# Patient Record
Sex: Female | Born: 1965 | Race: Black or African American | Hispanic: No | Marital: Single | Smoking: Never smoker
Health system: Southern US, Community
[De-identification: ages and names within clinical notes are randomized; demographics above are authoritative.]

## PROBLEM LIST (undated history)

## (undated) DIAGNOSIS — I10 Essential (primary) hypertension: Secondary | ICD-10-CM

## (undated) DIAGNOSIS — K219 Gastro-esophageal reflux disease without esophagitis: Secondary | ICD-10-CM

## (undated) HISTORY — DX: Essential (primary) hypertension: I10

## (undated) HISTORY — PX: ABDOMINAL HYSTERECTOMY: SHX81

---

## 2004-12-08 ENCOUNTER — Ambulatory Visit: Payer: Self-pay | Admitting: Obstetrics and Gynecology

## 2007-11-26 ENCOUNTER — Ambulatory Visit: Payer: Self-pay | Admitting: Obstetrics and Gynecology

## 2007-12-23 ENCOUNTER — Ambulatory Visit: Payer: Self-pay | Admitting: Obstetrics and Gynecology

## 2007-12-29 ENCOUNTER — Inpatient Hospital Stay: Payer: Self-pay | Admitting: Obstetrics and Gynecology

## 2008-12-08 ENCOUNTER — Ambulatory Visit: Payer: Self-pay | Admitting: Obstetrics and Gynecology

## 2009-12-27 ENCOUNTER — Ambulatory Visit: Payer: Self-pay | Admitting: Obstetrics and Gynecology

## 2011-02-06 ENCOUNTER — Ambulatory Visit: Payer: Self-pay | Admitting: Obstetrics and Gynecology

## 2011-02-09 ENCOUNTER — Ambulatory Visit: Payer: Self-pay | Admitting: Obstetrics and Gynecology

## 2011-03-06 ENCOUNTER — Ambulatory Visit: Payer: Self-pay | Admitting: Surgery

## 2011-03-06 HISTORY — PX: BREAST BIOPSY: SHX20

## 2011-03-08 LAB — PATHOLOGY REPORT

## 2011-09-03 ENCOUNTER — Ambulatory Visit: Payer: Self-pay | Admitting: Surgery

## 2012-03-12 ENCOUNTER — Ambulatory Visit: Payer: Self-pay | Admitting: Obstetrics and Gynecology

## 2013-03-16 ENCOUNTER — Ambulatory Visit: Payer: Self-pay | Admitting: Obstetrics and Gynecology

## 2014-03-17 ENCOUNTER — Ambulatory Visit: Payer: Self-pay | Admitting: Obstetrics and Gynecology

## 2015-03-14 ENCOUNTER — Other Ambulatory Visit: Payer: Self-pay | Admitting: Obstetrics and Gynecology

## 2015-03-14 DIAGNOSIS — Z1231 Encounter for screening mammogram for malignant neoplasm of breast: Secondary | ICD-10-CM

## 2015-03-21 ENCOUNTER — Ambulatory Visit
Admission: RE | Admit: 2015-03-21 | Discharge: 2015-03-21 | Disposition: A | Discharge status: Home / Self Care | Payer: PRIVATE HEALTH INSURANCE | Source: Ambulatory Visit | Attending: Obstetrics and Gynecology | Admitting: Obstetrics and Gynecology

## 2015-03-21 DIAGNOSIS — Z1231 Encounter for screening mammogram for malignant neoplasm of breast: Secondary | ICD-10-CM | POA: Diagnosis not present

## 2016-03-23 ENCOUNTER — Ambulatory Visit: Payer: Self-pay | Admitting: Physician Assistant

## 2016-03-23 ENCOUNTER — Encounter: Payer: Self-pay | Admitting: Physician Assistant

## 2016-03-23 VITALS — BP 130/90 | HR 74 | Temp 98.5°F

## 2016-03-23 DIAGNOSIS — J0100 Acute maxillary sinusitis, unspecified: Secondary | ICD-10-CM

## 2016-03-23 MED ORDER — FLUTICASONE PROPIONATE 50 MCG/ACT NA SUSP: 2.0000 | Freq: Every day | NASAL | 6 refills | Status: DC

## 2016-03-23 MED ORDER — AMOXICILLIN 875 MG PO TABS: 875.0000 mg | ORAL_TABLET | Freq: Two times a day (BID) | ORAL | 0 refills | Status: DC

## 2016-03-23 MED ORDER — FLUCONAZOLE 150 MG PO TABS: ORAL_TABLET | ORAL | 0 refills | Status: DC

## 2016-03-23 NOTE — Progress Notes (Signed)
S: C/o sinus pain and  congestion for 3 days, some sore throat, face felt warm, using neti pot without relief, no fever, chills, cp/sob, v/d; mucus is green and thick, cough is sporadic, c/o of facial and dental pain.   Using otc meds:   O: PE: vitals wnl, nad, perrl eomi, normocephalic, tms dull, nasal mucosa red and swollen, throat injected, neck supple no lymph, lungs c t a, cv rrr, neuro intact  A:  Acute sinusitis   P: drink fluids, continue regular meds , use otc meds of choice, return if not improving in 5 days, return earlier if worsening . Amoxil , flonase, diflucan

## 2016-04-02 ENCOUNTER — Other Ambulatory Visit: Payer: Self-pay | Admitting: Obstetrics and Gynecology

## 2016-04-02 DIAGNOSIS — Z1231 Encounter for screening mammogram for malignant neoplasm of breast: Secondary | ICD-10-CM

## 2016-05-08 ENCOUNTER — Ambulatory Visit: Payer: PRIVATE HEALTH INSURANCE | Attending: Obstetrics and Gynecology

## 2016-05-09 ENCOUNTER — Ambulatory Visit
Admission: RE | Admit: 2016-05-09 | Discharge: 2016-05-09 | Disposition: A | Discharge status: Home / Self Care | Payer: Managed Care, Other (non HMO) | Source: Ambulatory Visit | Attending: Obstetrics and Gynecology | Admitting: Obstetrics and Gynecology

## 2016-05-09 DIAGNOSIS — Z1231 Encounter for screening mammogram for malignant neoplasm of breast: Secondary | ICD-10-CM

## 2016-08-06 ENCOUNTER — Other Ambulatory Visit: Payer: Self-pay | Admitting: Physician Assistant

## 2016-08-06 NOTE — Telephone Encounter (Signed)
Med refill for allegra d

## 2017-02-06 ENCOUNTER — Encounter: Payer: Self-pay | Admitting: Physician Assistant

## 2017-02-06 ENCOUNTER — Ambulatory Visit: Payer: Self-pay | Admitting: Physician Assistant

## 2017-02-06 VITALS — BP 129/79 | HR 80 | Temp 98.5°F | Resp 16

## 2017-02-06 DIAGNOSIS — S61211A Laceration without foreign body of left index finger without damage to nail, initial encounter: Secondary | ICD-10-CM

## 2017-02-06 MED ORDER — TETANUS-DIPHTH-ACELL PERTUSSIS 5-2.5-18.5 LF-MCG/0.5 IM SUSP
0.5000 mL | Freq: Once | INTRAMUSCULAR | Status: AC
  Administered 2017-02-06: 0.5 mL via INTRAMUSCULAR

## 2017-02-06 NOTE — Progress Notes (Signed)
S: pt c/o cutting finger around 1130 am today, was cutting a watermelon at home and the knife slipped, area has been bleeding, ?if needs stitches, unknown tdap  O: vitals wnl, nad, skin on left infex finger with 1 cm superficial laceration, no fatty tissue noted, no fb, no tendon involvment, full rom of finger, n/v intact, used 1% xylocain digital block, area cleaned with betadine, rinsed with saline, used 5-0 ethilon for 3 simple sutures, neosporin and bandage applied,  pt tolerated procedure well  A: laceration with suture repair  P: suture care explained, remove sutures in 7 days, tdap given in clinic

## 2017-02-13 ENCOUNTER — Ambulatory Visit: Payer: Self-pay | Admitting: Physician Assistant

## 2017-02-13 ENCOUNTER — Encounter: Payer: Self-pay | Admitting: Physician Assistant

## 2017-02-13 VITALS — BP 119/70 | HR 76 | Temp 98.5°F | Resp 16

## 2017-02-13 DIAGNOSIS — Z4802 Encounter for removal of sutures: Secondary | ICD-10-CM

## 2017-02-13 NOTE — Progress Notes (Signed)
S: had sutures in finger a week ago, area is well healed, one suture came out on its own, no fever/chills or drainage  O: vitals wnl, nad, skin with well approximated wound, healed, no pus or drainage noted, sutures removed  A: suture removal  P: f/u prn

## 2017-04-05 ENCOUNTER — Other Ambulatory Visit: Payer: Self-pay | Admitting: Obstetrics and Gynecology

## 2017-04-05 DIAGNOSIS — Z1231 Encounter for screening mammogram for malignant neoplasm of breast: Secondary | ICD-10-CM

## 2017-05-13 ENCOUNTER — Ambulatory Visit
Admission: RE | Admit: 2017-05-13 | Discharge: 2017-05-13 | Disposition: A | Discharge status: Home / Self Care | Payer: Managed Care, Other (non HMO) | Source: Ambulatory Visit | Attending: Obstetrics and Gynecology | Admitting: Obstetrics and Gynecology

## 2017-05-13 DIAGNOSIS — Z1231 Encounter for screening mammogram for malignant neoplasm of breast: Secondary | ICD-10-CM

## 2017-05-14 ENCOUNTER — Other Ambulatory Visit: Payer: Self-pay | Admitting: Obstetrics and Gynecology

## 2017-05-14 DIAGNOSIS — R928 Other abnormal and inconclusive findings on diagnostic imaging of breast: Secondary | ICD-10-CM

## 2017-05-14 DIAGNOSIS — N6489 Other specified disorders of breast: Secondary | ICD-10-CM

## 2017-05-14 DIAGNOSIS — R921 Mammographic calcification found on diagnostic imaging of breast: Secondary | ICD-10-CM

## 2017-05-23 ENCOUNTER — Ambulatory Visit
Admission: RE | Admit: 2017-05-23 | Discharge: 2017-05-23 | Disposition: A | Discharge status: Home / Self Care | Payer: Managed Care, Other (non HMO) | Source: Ambulatory Visit | Attending: Obstetrics and Gynecology | Admitting: Obstetrics and Gynecology

## 2017-05-23 DIAGNOSIS — N6002 Solitary cyst of left breast: Secondary | ICD-10-CM | POA: Insufficient documentation

## 2017-05-23 DIAGNOSIS — R921 Mammographic calcification found on diagnostic imaging of breast: Secondary | ICD-10-CM | POA: Diagnosis not present

## 2017-05-23 DIAGNOSIS — N6489 Other specified disorders of breast: Secondary | ICD-10-CM | POA: Insufficient documentation

## 2017-05-23 DIAGNOSIS — R928 Other abnormal and inconclusive findings on diagnostic imaging of breast: Secondary | ICD-10-CM

## 2017-05-28 ENCOUNTER — Other Ambulatory Visit: Payer: Self-pay | Admitting: Obstetrics and Gynecology

## 2017-05-28 DIAGNOSIS — R928 Other abnormal and inconclusive findings on diagnostic imaging of breast: Secondary | ICD-10-CM

## 2017-05-28 DIAGNOSIS — R921 Mammographic calcification found on diagnostic imaging of breast: Secondary | ICD-10-CM

## 2017-05-30 ENCOUNTER — Ambulatory Visit
Admission: RE | Admit: 2017-05-30 | Discharge: 2017-05-30 | Disposition: A | Discharge status: Home / Self Care | Payer: Managed Care, Other (non HMO) | Source: Ambulatory Visit | Attending: Obstetrics and Gynecology | Admitting: Obstetrics and Gynecology

## 2017-05-30 DIAGNOSIS — R928 Other abnormal and inconclusive findings on diagnostic imaging of breast: Secondary | ICD-10-CM

## 2017-05-30 DIAGNOSIS — R921 Mammographic calcification found on diagnostic imaging of breast: Secondary | ICD-10-CM | POA: Diagnosis not present

## 2017-05-30 HISTORY — PX: BREAST BIOPSY: SHX20

## 2017-06-03 LAB — SURGICAL PATHOLOGY

## 2017-07-17 ENCOUNTER — Ambulatory Visit: Payer: Self-pay | Admitting: Family Medicine

## 2017-07-17 VITALS — BP 160/93 | HR 72 | Temp 98.6°F

## 2017-07-17 DIAGNOSIS — J0100 Acute maxillary sinusitis, unspecified: Secondary | ICD-10-CM

## 2017-07-17 DIAGNOSIS — J358 Other chronic diseases of tonsils and adenoids: Secondary | ICD-10-CM

## 2017-07-17 MED ORDER — FLUCONAZOLE 150 MG PO TABS: 150.0000 mg | ORAL_TABLET | Freq: Once | ORAL | 0 refills | Status: AC

## 2017-07-17 MED ORDER — AMOXICILLIN-POT CLAVULANATE 875-125 MG PO TABS: 1.0000 | ORAL_TABLET | Freq: Two times a day (BID) | ORAL | 0 refills | Status: DC

## 2017-07-17 NOTE — Progress Notes (Signed)
Pt reports maxillary sinus pressure worsening over past 2-3 days. +PND, L ear fullness. Denies cough other than first than in the morning. Also c/o L tonsil stone. Hx of same, normally resolves within a day, but this time has not. Has been using saltwater gargles.

## 2017-07-17 NOTE — Patient Instructions (Signed)
Try to express the tonsillar debris as discussed.  Gargle with salt water several times a day  If the throat keeps bothering you return for a recheck and/or see your primary care doctor or ear nose and throat doctor.  Take the Augmentin 1 twice daily (amoxicillin/clavulanate)  If you get any vaginal yeast symptoms you can take the Diflucan 1 pill initially and repeat in 3 days only if needed.  You have some fluticasone (Flonase) already.  You can use it 2 sprays each nostril daily if needed.

## 2017-07-17 NOTE — Progress Notes (Signed)
Patient ID: Kim Morrison, female    DOB: 1966/05/07  Age: 52 y.o. MRN: [id number]  Chief Complaint  Patient presents with  . Sinusitis    Subjective:   Patient has been having congestion and facial pain a little cough and has noticed tonsillar debris on the left side which is bothering her today.    Current allergies, medications, problem list, past/family and social histories reviewed.  Objective:  BP (!) 160/93 (BP Location: Left Arm, Patient Position: Sitting)   Pulse 72   Temp 98.6 F (37 C) (Tympanic)   SpO2 97%   No major distress.  TMs normal.  Mild sinus tenderness.  Throat not erythematous.  She has white tonsillar debris just behind the edge of the tongue.  Attempt was made to express this using a tongue blade, but I was unable to get it to pop out.  Spoke to her about how to try and express it.  I irritated that a little bit trying to squeeze it out.   Assessment & Plan:   Assessment: 1. Tonsillar debris   2. Subacute maxillary sinusitis       Plan: See instructions  No orders of the defined types were placed in this encounter.   No orders of the defined types were placed in this encounter.        Patient Instructions  Try to express the tonsillar debris as discussed.  Gargle with salt water several times a day  If the throat keeps bothering you return for a recheck and/or see your primary care doctor or ear nose and throat doctor.  Take the Augmentin 1 twice daily (amoxicillin/clavulanate)  If you get any vaginal yeast symptoms you can take the Diflucan 1 pill initially and repeat in 3 days only if needed.  You have some fluticasone (Flonase) already.  You can use it 2 sprays each nostril daily if needed.     Return if symptoms worsen or fail to improve.   HOPPER,DAVID, MD 07/17/2017

## 2017-07-26 ENCOUNTER — Encounter: Payer: Self-pay | Admitting: Physician Assistant

## 2017-07-26 ENCOUNTER — Ambulatory Visit: Payer: Self-pay | Admitting: Physician Assistant

## 2017-07-26 VITALS — BP 120/88 | HR 72 | Temp 97.8°F

## 2017-07-26 DIAGNOSIS — M778 Other enthesopathies, not elsewhere classified: Secondary | ICD-10-CM

## 2017-07-26 MED ORDER — NAPROXEN 500 MG PO TABS: 500.0000 mg | ORAL_TABLET | Freq: Two times a day (BID) | ORAL | Status: DC

## 2017-07-26 NOTE — Progress Notes (Signed)
   Subjective: Right wrist pain    Patient ID: Kim Morrison, female    DOB: 07-Jun-1966, 52 y.o.   MRN: [id number]  HPI Patient complain of right wrist pain for approximately 2 weeks.  Patient states the only provocative incident is repetitive twisting of the using keys to unlock and lateral cell doors.  Patient denies loss of sensation or function.  Patient is right-hand dominant.  Review of Systems Negative except for complaint    Objective:   Physical Exam Examination of the right wrist hand reveals no deformity.  Patient has full neck range of motion.  Patient is moderate crepitus to palpation base of the proximal phalanges.       Assessment & Plan: Tendinitis right wrist.  Patient placed in a splint and given a prescription for naproxen.  Patient placed on work restrictions for 1 week.  Patient will return in 1 week for reevaluation.

## 2017-08-02 ENCOUNTER — Ambulatory Visit: Payer: Self-pay | Admitting: Family Medicine

## 2017-08-02 ENCOUNTER — Encounter: Payer: Self-pay | Admitting: Family Medicine

## 2017-08-02 VITALS — BP 136/88 | HR 70 | Temp 98.2°F | Resp 18

## 2017-08-02 DIAGNOSIS — M778 Other enthesopathies, not elsewhere classified: Secondary | ICD-10-CM

## 2017-08-02 NOTE — Progress Notes (Signed)
   Subjective: Right wrist pain    Patient ID: Kim Morrison, female    DOB: 1965/07/24, 52 y.o.   MRN: [id number]  HPI 07/26/17: Patient complain of right wrist pain for approximately 2 weeks.  Patient states the only provocative incident is repetitive twisting of the using keys to unlock and lateral cell doors.  Patient denies loss of sensation or function.  Patient is right-hand dominant. 08/02/17: Patient returns today for follow-up of right wrist pain.  Patient reports the pain has improved with a week of light duty, naproxen, and wearing a brace.  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  Patient reports tolerating naproxen well.  Patient reports pain is due to overuse not related to a specific injury.  Patient reports improved function of her wrist overall. Review of Systems Negative except for complaint    Objective:   Physical Exam 07/26/17: Examination of the right wrist hand reveals no deformity.  Patient has full neck range of motion.  Patient is moderate crepitus to palpation base of the proximal phalanges. 08/02/17: Examination unchanged from previous visit.  No ecchymosis, edema, deformity.       Assessment & Plan: Tendinitis right wrist.  07/26/17: Patient placed in a splint and given a prescription for naproxen.  Patient placed on work restrictions for 1 week.  Patient will return in 1 week for reevaluation. 08/02/17: Extended work restrictions for another week, recommended gradually resuming normal work activities following that.  Recommended using the splint for an additional week but to make sure to stretch her thumb and wrist.  Recommended an additional 2 weeks of naproxen for pain and inflammation and discussed GI protection with lansoprazole.  Educated patient to begin home stretches and exercises once the pain begins to improve further.  Also discussed ergonomic assessment of her  work space to help with the resolution of her symptoms and prevention of recurrence in the future.  Informed the patient to call or follow-up with her primary care provider in 4 weeks if she has not experienced any improvement will consider referral for physical therapy or orthopedics at that time.

## 2017-08-05 ENCOUNTER — Ambulatory Visit: Payer: Self-pay

## 2017-09-18 ENCOUNTER — Telehealth: Payer: Self-pay

## 2017-09-18 NOTE — Telephone Encounter (Signed)
Received refill fax for Fluticasone 87mcg and Allegra-D 12hr  Last OV: 08/02/17

## 2017-09-19 ENCOUNTER — Other Ambulatory Visit: Payer: Self-pay | Admitting: Family Medicine

## 2017-09-19 DIAGNOSIS — J309 Allergic rhinitis, unspecified: Secondary | ICD-10-CM

## 2017-09-19 MED ORDER — FEXOFENADINE-PSEUDOEPHED ER 60-120 MG PO TB12: 1.0000 | ORAL_TABLET | Freq: Two times a day (BID) | ORAL | 0 refills | Status: DC

## 2017-09-19 MED ORDER — FLUTICASONE PROPIONATE 50 MCG/ACT NA SUSP: NASAL | 0 refills | Status: AC

## 2017-09-19 NOTE — Telephone Encounter (Signed)
I sent in one refill for each. Please inform the patient that she needs to obtain future refills from a primary care provider.

## 2017-11-01 ENCOUNTER — Other Ambulatory Visit: Payer: Self-pay | Admitting: Obstetrics and Gynecology

## 2017-11-01 DIAGNOSIS — R921 Mammographic calcification found on diagnostic imaging of breast: Secondary | ICD-10-CM

## 2017-11-07 ENCOUNTER — Ambulatory Visit: Payer: Self-pay | Admitting: Family Medicine

## 2017-11-07 VITALS — BP 144/88 | HR 69 | Resp 16 | Ht 67.0 in | Wt 184.0 lb

## 2017-11-07 DIAGNOSIS — Z0189 Encounter for other specified special examinations: Principal | ICD-10-CM

## 2017-11-07 DIAGNOSIS — Z008 Encounter for other general examination: Secondary | ICD-10-CM

## 2017-11-07 NOTE — Progress Notes (Signed)
Subjective: Annual biometrics screening  Patient presents for her annual biometric screening. Patient reports eating a healthy, well-rounded diet and getting regular exercise.  Patient sees her OB/GYN for primary care. PCP: Dr. Leafy Ro. Patient works for the [employer]. Patient denies any other issues or concerns.   Review of Systems Unremarkable  Objective  Physical Exam General: Awake, alert and oriented. No acute distress. Well developed, hydrated and nourished. Appears stated age.  HEENT: Supple neck without adenopathy. Sclera is non-icteric. The ear canal is clear without discharge. The tympanic membrane is normal in appearance with normal landmarks and cone of light. Nasal mucosa is pink and moist. Oral mucosa is pink and moist. The pharynx is normal in appearance without tonsillar swelling or exudates.  Skin: Skin in warm, dry and intact without rashes or lesions. Appropriate color for ethnicity. Cardiac: Heart rate and rhythm are normal. No murmurs, gallops, or rubs are auscultated.  Respiratory: The chest wall is symmetric and without deformity. No signs of respiratory distress. Lung sounds are clear in all lobes bilaterally without rales, ronchi, or wheezes.  Neurological: The patient is awake, alert and oriented to person, place, and time with normal speech.  Memory is normal and thought processes intact. No gait abnormalities are appreciated.  Psychiatric: Appropriate mood and affect.   Assessment Annual biometrics screening  Plan  Lipid panel and fasting blood sugar pending. Encouraged routine visits with primary care provider.  Patient's blood pressure is 144/88 today.  Discussed normal values.  Advised patient to monitor this regularly and report abnormal values to her primary care provider. Encouraged patient to get regular exercise and eat a healthy, well-rounded diet.

## 2017-11-08 LAB — LIPID PANEL
Chol/HDL Ratio: 3.6 ratio (ref 0.0–4.4)
Cholesterol, Total: 200 mg/dL — ABNORMAL HIGH (ref 100–199)
HDL: 55 mg/dL (ref >39)
LDL Calculated: 129 mg/dL — ABNORMAL HIGH (ref 0–99)
Triglycerides: 78 mg/dL (ref 0–149)
VLDL Cholesterol Cal: 16 mg/dL (ref 5–40)

## 2017-11-08 LAB — GLUCOSE, RANDOM: Glucose: 90 mg/dL (ref 65–99)

## 2017-11-08 NOTE — Progress Notes (Signed)
Kim Morrison, Will you call the patient and inform them that their lipid panel and fasting blood sugar came back?  Everything is normal, with the exception of their total cholesterol and LDL cholesterol.  The total cholesterol is elevated at 200, normal values are between 100 and 199.  The LDL cholesterol ("bad cholesterol") is elevated at 129, normal values are below 99. Please advise the patient to follow-up with their primary care provider regarding these results.

## 2017-11-29 ENCOUNTER — Ambulatory Visit
Admission: RE | Admit: 2017-11-29 | Discharge: 2017-11-29 | Disposition: A | Discharge status: Home / Self Care | Payer: Managed Care, Other (non HMO) | Source: Ambulatory Visit | Attending: Obstetrics and Gynecology | Admitting: Obstetrics and Gynecology

## 2017-11-29 DIAGNOSIS — R921 Mammographic calcification found on diagnostic imaging of breast: Secondary | ICD-10-CM | POA: Insufficient documentation

## 2017-12-18 ENCOUNTER — Ambulatory Visit (INDEPENDENT_AMBULATORY_CARE_PROVIDER_SITE_OTHER): Payer: Managed Care, Other (non HMO) | Admitting: General Surgery

## 2017-12-18 ENCOUNTER — Encounter: Payer: Self-pay | Admitting: General Surgery

## 2017-12-18 VITALS — BP 140/80 | HR 84 | Resp 14 | Ht 67.0 in | Wt 185.0 lb

## 2017-12-18 DIAGNOSIS — R928 Other abnormal and inconclusive findings on diagnostic imaging of breast: Secondary | ICD-10-CM | POA: Insufficient documentation

## 2017-12-18 NOTE — Patient Instructions (Addendum)
Patient to have a left breast excision . The patient is aware to call back for any questions or concerns.  The patient is scheduled for surgery at [facility] on 12/25/17. She will pre admit by phone. The patient is aware of date and instructions.

## 2017-12-18 NOTE — Progress Notes (Signed)
Patient ID: Kim Morrison, female   DOB: 09-Feb-1966, 52 y.o.   MRN: [id number]  Chief Complaint  Patient presents with  . Other    HPI Kim Morrison is a 52 y.o. female who presents for a breast evaluation. The most recent mammogram was done on  11/29/2017 .  Patient does perform regular self breast checks and gets regular mammograms done.    HPI  Past Medical History:  Diagnosis Date  . Hypertension     Past Surgical History:  Procedure Laterality Date  . BREAST BIOPSY Left 03/06/2011   negative  . 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 Breast cancer Neg Hx     Social History Social History   Tobacco Use  . Smoking status: Never Smoker  . Smokeless tobacco: Never Used  Substance Use Topics  . Alcohol use: Not on file  . Drug use: Not on file    No Known Allergies  Current Outpatient Medications  Medication Sig Dispense Refill  . bisoprolol-hydrochlorothiazide (ZIAC) 2.5-6.25 MG tablet take 1 tablet by mouth once daily    . fexofenadine-pseudoephedrine (ALLEGRA-D ALLERGY & CONGESTION) 60-120 MG 12 hr tablet Take 1 tablet by mouth 2 (two) times daily. PRN allergies 60 tablet 0  . fluticasone (FLONASE) 50 MCG/ACT nasal spray 1-2 sprays in each nostril daily 16 g 0   No current facility-administered medications for this visit.     Review of Systems Review of Systems  Constitutional: Negative.   Respiratory: Negative.   Cardiovascular: Negative.     Blood pressure 140/80, pulse 84, resp. rate 14, height 5\' 7"  (1.702 m), weight 185 lb (83.9 kg).  Physical Exam Physical Exam  Constitutional: She is oriented to person, place, and time. She appears well-developed and well-nourished.  Eyes: Conjunctivae are normal. No scleral icterus.  Neck: Neck supple.  Cardiovascular: Normal rate, regular rhythm and normal heart sounds.  Pulmonary/Chest: Effort  normal and breath sounds normal. Right breast exhibits no inverted nipple, no mass, no nipple discharge, no skin change and no tenderness. Left breast exhibits no inverted nipple, no mass, no nipple discharge, no skin change and no tenderness.  Lymphadenopathy:    She has no cervical adenopathy.    She has no axillary adenopathy.  Neurological: She is alert and oriented to person, place, and time.  Skin: Skin is warm and dry.    Data Reviewed May 30, 2017 vacuum biopsy of the left breast, upper outer quadrant for microcalcifications and associated distortion: DIAGNOSIS:  A. 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  - DEEPER SECTIONS EXAMINED.  - NEGATIVE FOR ATYPIA AND MALIGNANCY.   March 06, 2011 left breast biopsy: Diagnosis:  LEFT BREAST BIOPSY:  SCANT CORES OF BENIGN BREAST TISSUE WITH FOCAL SCLEROSING ADENOSIS.  FRAGMENT CONSISTENT WITH CYST WALL NOTED. NO EVIDENCE OF ATYPIA OR  MALIGNANCY IN THE HISTOLOGIC SECTIONS. (SEE COMMENT.)   November 29, 2017 left breast six-month follow-up mammogram reviewed.  Accentuated architectural distortion post biopsy with the clip 1.2 cm medial to the dominant area.  BI-RADS-4.  Assessment    Persistent architectural distortion with progression over 6 months.    Plan The films were reviewed independently, and the area of distortion and nodularity in the upper outer quadrant of the left breast is become more pronounced.  I think  it is reasonable to perform a open biopsy.  The procedure was reviewed, including the need for preoperative wire localization.  She works at [employer], and the need to be in a non-inmate environment for the first 2 weeks after surgery was reviewed.  Patient to have a left breast excision . The patient is aware to call back for any questions or concerns.   HPI, Physical Exam, Assessment and Plan  have been scribed under the direction and in the presence of Hervey Ard, MD. Gaspar Cola, CMA  I have completed the exam and reviewed the above documentation for accuracy and completeness.  I agree with the above.  Haematologist has been used and any errors in dictation or transcription are unintentional.  Hervey Ard, M.D., F.A.C.S. The patient is scheduled for surgery at [facility] on 12/25/17. She will pre admit by phone. The patient is aware of date and instructions.  Documented by Caryl-Lyn Otis Brace LPN  Kim Morrison 12/18/2017, 5:08 PM

## 2017-12-19 ENCOUNTER — Other Ambulatory Visit: Payer: Self-pay | Admitting: General Surgery

## 2017-12-19 ENCOUNTER — Telehealth: Payer: Self-pay

## 2017-12-19 ENCOUNTER — Ambulatory Visit: Payer: Self-pay | Admitting: General Surgery

## 2017-12-19 DIAGNOSIS — R928 Other abnormal and inconclusive findings on diagnostic imaging of breast: Secondary | ICD-10-CM

## 2017-12-19 NOTE — Telephone Encounter (Signed)
Call to patient to make aware of arrival time and location for surgery. The patient is to arrive at the [facility] on 12/25/17 at 9:15 am. She is aware of date, time, and instructions.

## 2017-12-23 ENCOUNTER — Encounter
Admission: RE | Admit: 2017-12-23 | Discharge: 2017-12-23 | Disposition: A | Discharge status: Home / Self Care | Payer: PRIVATE HEALTH INSURANCE | Source: Ambulatory Visit | Attending: General Surgery | Admitting: General Surgery

## 2017-12-23 ENCOUNTER — Other Ambulatory Visit: Payer: Self-pay

## 2017-12-23 HISTORY — DX: Gastro-esophageal reflux disease without esophagitis: K21.9

## 2017-12-23 NOTE — Patient Instructions (Signed)
Your procedure is scheduled on: 12-25-17 [facility] Report to [place] @ 9:15 AM  Remember: Instructions that are not followed completely may result in serious medical risk, up to and including death, or upon the discretion of your surgeon and anesthesiologist your surgery may need to be rescheduled.    _x___ 1. Do not eat food after midnight the night before your procedure. NO GUM OR CANDY AFTER MIDNIGHT.  You may drink clear liquids up to 2 hours before you are scheduled to arrive at the hospital for your procedure.  Do not drink clear liquids within 2 hours of your scheduled arrival to the hospital.  Clear liquids include  --Water or Apple juice without pulp  --Clear carbohydrate beverage such as ClearFast or Gatorade  --Black Coffee or Clear Tea (No milk, no creamers, do not add anything to the coffee or Tea    __x__ 2. No Alcohol for 24 hours before or after surgery.   __x__3. No Smoking or e-cigarettes for 24 prior to surgery.  Do not use any chewable tobacco products for at least 6 hour prior to surgery   ____  4. Bring all medications with you on the day of surgery if instructed.    __x__ 5. Notify your doctor if there is any change in your medical condition     (cold, fever, infections).    x___6. On the morning of surgery brush your teeth with toothpaste and water.  You may rinse your mouth with mouth wash if you wish.  Do not swallow any toothpaste or mouthwash.   Do not wear jewelry, make-up, hairpins, clips or nail polish.  Do not wear lotions, powders, or perfumes. You may wear deodorant.  Do not shave 48 hours prior to surgery. Men may shave face and neck.  Do not bring valuables to the hospital.    [facility] is not responsible for any belongings or valuables.               Contacts, dentures or bridgework may not be worn into surgery.  Leave your suitcase in the car. After surgery it may be brought to your room.  For patients admitted to the hospital,  discharge time is determined by your  treatment team.  _  Patients discharged the day of surgery will not be allowed to drive home.  You will need someone to drive you home and stay with you the night of your procedure.    Please read over the following fact sheets that you were given:   [facility] Preparing for Surgery  _x___ TAKE THE FOLLOWING MEDICATION THE MORNING OF SURGERY WITH A SMALL SIP OF WATER. These include:  1. [place]  2. TAKE A NEXIUM THE NIGHT BEFORE YOUR SURGERY  3.  4.  5.  6.  ____Fleets enema or Magnesium Citrate as directed.   _x___ Use CHG Soap or sage wipes as directed on instruction sheet   ____ Use inhalers on the day of surgery and bring to hospital day of surgery  ____ Stop Metformin and Janumet 2 days prior to surgery.    ____ Take 1/2 of usual insulin dose the night before surgery and none on the morning surgery.   ____ Follow recommendations from Cardiologist, Pulmonologist or PCP regarding stopping Aspirin, Coumadin, Plavix ,Eliquis, Effient, or Pradaxa, and Pletal.  ____Stop Anti-inflammatories such as Advil, Aleve, Ibuprofen, Motrin, Naproxen, Naprosyn, Goodies powders or aspirin products. OK to take Tylenol    _x___ Stop supplements until after surgery-STOP FISH  OIL NOW-MAY RESUME AFTER SURGERY   ____ Bring C-Pap to the hospital.

## 2017-12-24 ENCOUNTER — Encounter
Admission: RE | Admit: 2017-12-24 | Discharge: 2017-12-24 | Disposition: A | Discharge status: Home / Self Care | Payer: Managed Care, Other (non HMO) | Source: Ambulatory Visit | Attending: General Surgery | Admitting: General Surgery

## 2017-12-24 DIAGNOSIS — K219 Gastro-esophageal reflux disease without esophagitis: Secondary | ICD-10-CM | POA: Diagnosis not present

## 2017-12-24 DIAGNOSIS — Z01812 Encounter for preprocedural laboratory examination: Secondary | ICD-10-CM | POA: Diagnosis not present

## 2017-12-24 DIAGNOSIS — Z79899 Other long term (current) drug therapy: Secondary | ICD-10-CM | POA: Diagnosis not present

## 2017-12-24 DIAGNOSIS — R928 Other abnormal and inconclusive findings on diagnostic imaging of breast: Secondary | ICD-10-CM | POA: Diagnosis not present

## 2017-12-24 DIAGNOSIS — I10 Essential (primary) hypertension: Secondary | ICD-10-CM | POA: Diagnosis not present

## 2017-12-24 DIAGNOSIS — D242 Benign neoplasm of left breast: Secondary | ICD-10-CM | POA: Diagnosis not present

## 2017-12-24 DIAGNOSIS — Z01810 Encounter for preprocedural cardiovascular examination: Secondary | ICD-10-CM | POA: Diagnosis not present

## 2017-12-24 LAB — POTASSIUM: Potassium: 3.9 mmol/L (ref 3.5–5.1)

## 2017-12-25 ENCOUNTER — Other Ambulatory Visit: Payer: Self-pay

## 2017-12-25 ENCOUNTER — Ambulatory Visit
Admission: RE | Admit: 2017-12-25 | Discharge: 2017-12-25 | Disposition: A | Discharge status: Home / Self Care | Payer: Managed Care, Other (non HMO) | Source: Ambulatory Visit | Attending: General Surgery | Admitting: General Surgery

## 2017-12-25 ENCOUNTER — Encounter
Admission: RE | Disposition: A | Discharge status: Home / Self Care | Payer: Self-pay | Source: Ambulatory Visit | Attending: General Surgery

## 2017-12-25 ENCOUNTER — Ambulatory Visit: Payer: Managed Care, Other (non HMO) | Admitting: Certified Registered"

## 2017-12-25 DIAGNOSIS — R928 Other abnormal and inconclusive findings on diagnostic imaging of breast: Secondary | ICD-10-CM | POA: Diagnosis not present

## 2017-12-25 DIAGNOSIS — Z79899 Other long term (current) drug therapy: Secondary | ICD-10-CM | POA: Insufficient documentation

## 2017-12-25 DIAGNOSIS — D242 Benign neoplasm of left breast: Secondary | ICD-10-CM | POA: Insufficient documentation

## 2017-12-25 DIAGNOSIS — Z01810 Encounter for preprocedural cardiovascular examination: Secondary | ICD-10-CM | POA: Insufficient documentation

## 2017-12-25 DIAGNOSIS — Z01812 Encounter for preprocedural laboratory examination: Secondary | ICD-10-CM | POA: Insufficient documentation

## 2017-12-25 DIAGNOSIS — I10 Essential (primary) hypertension: Secondary | ICD-10-CM | POA: Insufficient documentation

## 2017-12-25 DIAGNOSIS — K219 Gastro-esophageal reflux disease without esophagitis: Secondary | ICD-10-CM | POA: Insufficient documentation

## 2017-12-25 HISTORY — PX: BREAST EXCISIONAL BIOPSY: SUR124

## 2017-12-25 HISTORY — PX: BREAST BIOPSY: SHX20

## 2017-12-25 SURGERY — BREAST BIOPSY WITH NEEDLE LOCALIZATION
Anesthesia: General | Site: Breast | Laterality: Left | Wound class: Clean

## 2017-12-25 MED ORDER — ACETAMINOPHEN 10 MG/ML IV SOLN
INTRAVENOUS | Status: DC | PRN
  Administered 2017-12-25: 1000 mg via INTRAVENOUS

## 2017-12-25 MED ORDER — KETOROLAC TROMETHAMINE 30 MG/ML IJ SOLN
INTRAMUSCULAR | Status: DC | PRN
  Administered 2017-12-25: 30 mg via INTRAVENOUS

## 2017-12-25 MED ORDER — DEXAMETHASONE SODIUM PHOSPHATE 10 MG/ML IJ SOLN
INTRAMUSCULAR | Status: AC
  Filled 2017-12-25: qty 1

## 2017-12-25 MED ORDER — MIDAZOLAM HCL 2 MG/2ML IJ SOLN
INTRAMUSCULAR | Status: AC
  Filled 2017-12-25: qty 4

## 2017-12-25 MED ORDER — GLYCOPYRROLATE 0.2 MG/ML IJ SOLN
INTRAMUSCULAR | Status: DC | PRN
  Administered 2017-12-25 (×2): 0.1 mg via INTRAVENOUS

## 2017-12-25 MED ORDER — FENTANYL CITRATE (PF) 100 MCG/2ML IJ SOLN
INTRAMUSCULAR | Status: DC | PRN
  Administered 2017-12-25 (×4): 50 ug via INTRAVENOUS

## 2017-12-25 MED ORDER — HYDROCODONE-ACETAMINOPHEN 5-325 MG PO TABS: 1.0000 | ORAL_TABLET | ORAL | 0 refills | Status: DC | PRN

## 2017-12-25 MED ORDER — PROPOFOL 10 MG/ML IV BOLUS
INTRAVENOUS | Status: DC | PRN
  Administered 2017-12-25: 170 mg via INTRAVENOUS

## 2017-12-25 MED ORDER — ONDANSETRON HCL 4 MG/2ML IJ SOLN
INTRAMUSCULAR | Status: AC
  Filled 2017-12-25: qty 2

## 2017-12-25 MED ORDER — MIDAZOLAM HCL 2 MG/2ML IJ SOLN
INTRAMUSCULAR | Status: DC | PRN
  Administered 2017-12-25 (×2): 2 mg via INTRAVENOUS

## 2017-12-25 MED ORDER — KETAMINE HCL 10 MG/ML IJ SOLN
INTRAMUSCULAR | Status: DC | PRN
  Administered 2017-12-25: 30 mg via INTRAVENOUS
  Administered 2017-12-25: 20 mg via INTRAVENOUS

## 2017-12-25 MED ORDER — CELECOXIB 200 MG PO CAPS
ORAL_CAPSULE | ORAL | Status: AC
  Filled 2017-12-25: qty 1

## 2017-12-25 MED ORDER — ACETAMINOPHEN 10 MG/ML IV SOLN
INTRAVENOUS | Status: AC
  Filled 2017-12-25: qty 100

## 2017-12-25 MED ORDER — BISOPROLOL FUMARATE 5 MG PO TABS
2.5000 mg | ORAL_TABLET | Freq: Once | ORAL | Status: AC
  Administered 2017-12-25: 2.5 mg via ORAL
  Filled 2017-12-25 (×2): qty 0.5

## 2017-12-25 MED ORDER — GLYCOPYRROLATE 0.2 MG/ML IJ SOLN
INTRAMUSCULAR | Status: AC
  Filled 2017-12-25: qty 1

## 2017-12-25 MED ORDER — PROPOFOL 10 MG/ML IV BOLUS
INTRAVENOUS | Status: AC
  Filled 2017-12-25: qty 20

## 2017-12-25 MED ORDER — FENTANYL CITRATE (PF) 100 MCG/2ML IJ SOLN: 25.0000 ug | INTRAMUSCULAR | Status: DC | PRN

## 2017-12-25 MED ORDER — LIDOCAINE HCL (CARDIAC) PF 100 MG/5ML IV SOSY
PREFILLED_SYRINGE | INTRAVENOUS | Status: DC | PRN
  Administered 2017-12-25: 100 mg via INTRAVENOUS

## 2017-12-25 MED ORDER — ONDANSETRON HCL 4 MG/2ML IJ SOLN
INTRAMUSCULAR | Status: DC | PRN
  Administered 2017-12-25: 4 mg via INTRAVENOUS

## 2017-12-25 MED ORDER — DEXAMETHASONE SODIUM PHOSPHATE 10 MG/ML IJ SOLN
INTRAMUSCULAR | Status: DC | PRN
  Administered 2017-12-25: 10 mg via INTRAVENOUS

## 2017-12-25 MED ORDER — LIDOCAINE HCL (PF) 2 % IJ SOLN
INTRAMUSCULAR | Status: AC
  Filled 2017-12-25: qty 10

## 2017-12-25 MED ORDER — BUPIVACAINE-EPINEPHRINE 0.5% -1:200000 IJ SOLN
INTRAMUSCULAR | Status: DC | PRN
  Administered 2017-12-25: 10 mL
  Administered 2017-12-25: 20 mL

## 2017-12-25 MED ORDER — LACTATED RINGERS IV SOLN
INTRAVENOUS | Status: DC
  Administered 2017-12-25: 11:00:00 via INTRAVENOUS

## 2017-12-25 MED ORDER — GABAPENTIN 300 MG PO CAPS
ORAL_CAPSULE | ORAL | Status: AC
  Filled 2017-12-25: qty 1

## 2017-12-25 MED ORDER — GABAPENTIN 300 MG PO CAPS
300.0000 mg | ORAL_CAPSULE | ORAL | Status: AC
  Administered 2017-12-25: 300 mg via ORAL

## 2017-12-25 MED ORDER — FENTANYL CITRATE (PF) 100 MCG/2ML IJ SOLN
INTRAMUSCULAR | Status: AC
  Filled 2017-12-25: qty 4

## 2017-12-25 MED ORDER — BUPIVACAINE-EPINEPHRINE (PF) 0.5% -1:200000 IJ SOLN
INTRAMUSCULAR | Status: AC
  Filled 2017-12-25: qty 30

## 2017-12-25 MED ORDER — KETOROLAC TROMETHAMINE 30 MG/ML IJ SOLN
INTRAMUSCULAR | Status: AC
  Filled 2017-12-25: qty 1

## 2017-12-25 MED ORDER — CELECOXIB 200 MG PO CAPS
200.0000 mg | ORAL_CAPSULE | ORAL | Status: AC
  Administered 2017-12-25: 200 mg via ORAL

## 2017-12-25 SURGICAL SUPPLY — 43 items
BINDER BREAST LRG (GAUZE/BANDAGES/DRESSINGS) ×3 IMPLANT
BINDER BREAST MEDIUM (GAUZE/BANDAGES/DRESSINGS) IMPLANT
BINDER BREAST XLRG (GAUZE/BANDAGES/DRESSINGS) IMPLANT
BINDER BREAST XXLRG (GAUZE/BANDAGES/DRESSINGS) IMPLANT
BLADE SURG 15 STRL SS SAFETY (BLADE) ×6 IMPLANT
CANISTER SUCT 1200ML W/VALVE (MISCELLANEOUS) ×3 IMPLANT
CHLORAPREP W/TINT 26ML (MISCELLANEOUS) ×6 IMPLANT
CLOSURE WOUND 1/2 X4 (GAUZE/BANDAGES/DRESSINGS) ×2
CNTNR SPEC 2.5X3XGRAD LEK (MISCELLANEOUS)
CONT SPEC 4OZ STER OR WHT (MISCELLANEOUS)
CONTAINER SPEC 2.5X3XGRAD LEK (MISCELLANEOUS) IMPLANT
COVER PROBE FLX POLY STRL (MISCELLANEOUS) ×3 IMPLANT
DEVICE DUBIN SPECIMEN MAMMOGRA (MISCELLANEOUS) ×3 IMPLANT
DRAPE CHEST BREAST 77X106 FENE (MISCELLANEOUS) ×3 IMPLANT
DRAPE LAPAROTOMY 100X77 ABD (DRAPES) ×3 IMPLANT
DRSG GAUZE FLUFF 36X18 (GAUZE/BANDAGES/DRESSINGS) ×3 IMPLANT
DRSG TELFA 4X3 1S NADH ST (GAUZE/BANDAGES/DRESSINGS) ×6 IMPLANT
ELECT CAUTERY BLADE TIP 2.5 (TIP) ×3
ELECT REM PT RETURN 9FT ADLT (ELECTROSURGICAL) ×3
ELECTRODE CAUTERY BLDE TIP 2.5 (TIP) ×1 IMPLANT
ELECTRODE REM PT RTRN 9FT ADLT (ELECTROSURGICAL) ×1 IMPLANT
GLOVE BIO SURGEON STRL SZ7.5 (GLOVE) ×3 IMPLANT
GLOVE INDICATOR 8.0 STRL GRN (GLOVE) ×3 IMPLANT
GOWN STRL REUS W/ TWL LRG LVL3 (GOWN DISPOSABLE) ×2 IMPLANT
GOWN STRL REUS W/TWL LRG LVL3 (GOWN DISPOSABLE) ×4
KIT TURNOVER KIT A (KITS) ×3 IMPLANT
LABEL OR SOLS (LABEL) ×3 IMPLANT
MARGIN MAP 10MM (MISCELLANEOUS) ×3 IMPLANT
NEEDLE HYPO 22GX1.5 SAFETY (NEEDLE) ×3 IMPLANT
NEEDLE HYPO 25X1 1.5 SAFETY (NEEDLE) ×3 IMPLANT
PACK BASIN MINOR ARMC (MISCELLANEOUS) ×3 IMPLANT
RETRACTOR RING XSMALL (MISCELLANEOUS) ×1 IMPLANT
RTRCTR WOUND ALEXIS 13CM XS SH (MISCELLANEOUS) ×3
STRIP CLOSURE SKIN 1/2X4 (GAUZE/BANDAGES/DRESSINGS) ×4 IMPLANT
SUT ETHILON 3-0 FS-10 30 BLK (SUTURE) ×3
SUT VIC AB 2-0 CT1 27 (SUTURE) ×2
SUT VIC AB 2-0 CT1 TAPERPNT 27 (SUTURE) ×1 IMPLANT
SUT VIC AB 4-0 FS2 27 (SUTURE) ×3 IMPLANT
SUTURE EHLN 3-0 FS-10 30 BLK (SUTURE) ×1 IMPLANT
SWABSTK COMLB BENZOIN TINCTURE (MISCELLANEOUS) ×6 IMPLANT
SYR 10ML LL (SYRINGE) ×3 IMPLANT
TAPE TRANSPORE STRL 2 31045 (GAUZE/BANDAGES/DRESSINGS) ×3 IMPLANT
WATER STERILE IRR 1000ML POUR (IV SOLUTION) ×3 IMPLANT

## 2017-12-25 NOTE — OR Nursing (Signed)
Dr. Bary Castilla in to see pt postop 1554, advises ok to discharge to home.

## 2017-12-25 NOTE — Anesthesia Post-op Follow-up Note (Signed)
Anesthesia QCDR form completed.        

## 2017-12-25 NOTE — H&P (Signed)
No change in clinical history or exam. Tolerated needle localization procedure well.  For wire localized biopsy of the left breast.

## 2017-12-25 NOTE — Discharge Instructions (Signed)

## 2017-12-25 NOTE — Anesthesia Preprocedure Evaluation (Addendum)
Anesthesia Evaluation  Patient identified by MRN, date of birth, ID band Patient awake    Reviewed: Allergy & Precautions, H&P , NPO status , Patient's Chart, lab work & pertinent test results  Airway Mallampati: I  TM Distance: >3 FB Neck ROM: full    Dental no notable dental hx.    Pulmonary neg pulmonary ROS, neg sleep apnea, neg COPD,    breath sounds clear to auscultation       Cardiovascular hypertension, (-) Past MI, (-) Cardiac Stents, (-) CABG and (-) CHF negative cardio ROS  (-) dysrhythmias  Rhythm:Regular Rate:Normal     Neuro/Psych negative neurological ROS  negative psych ROS   GI/Hepatic Neg liver ROS, GERD  ,  Endo/Other  negative endocrine ROSneg diabetes  Renal/GU negative Renal ROS  negative genitourinary   Musculoskeletal negative musculoskeletal ROS (+)   Abdominal   Peds negative pediatric ROS (+)  Hematology negative hematology ROS (+)   Anesthesia Other Findings Past Medical History: No date: GERD (gastroesophageal reflux disease)     Comment:  OCC  No date: Hypertension  Past Surgical History: No date: ABDOMINAL HYSTERECTOMY 03/06/2011: BREAST BIOPSY; Left     Comment:  negative 05/30/2017: BREAST BIOPSY; Left     Comment:  COLUMNAR CELL CHANGE AND SCLEROSING ADENOSIS WITH               ASSOCIATED      Reproductive/Obstetrics negative OB ROS                            Anesthesia Physical Anesthesia Plan  ASA: II  Anesthesia Plan: General LMA   Post-op Pain Management:    Induction: Intravenous  PONV Risk Score and Plan: 3 and Dexamethasone and Ondansetron  Airway Management Planned: LMA  Additional Equipment:   Intra-op Plan:   Post-operative Plan: Extubation in OR  Informed Consent: I have reviewed the patients History and Physical, chart, labs and discussed the procedure including the risks, benefits and alternatives for the proposed  anesthesia with the patient or authorized representative who has indicated his/her understanding and acceptance.   Dental Advisory Given  Plan Discussed with: Anesthesiologist, CRNA and Surgeon  Anesthesia Plan Comments: (Patient consented for risks of anesthesia including but not limited to:  - adverse reactions to medications - damage to teeth, lips or other oral mucosa - sore throat or hoarseness - Damage to heart, brain, lungs or loss of life  Patient voiced understanding.)       Anesthesia Quick Evaluation

## 2017-12-25 NOTE — Op Note (Signed)
Preoperative diagnosis: Abnormal left breast mammogram.  Postoperative diagnosis: Same.  Operative procedure: Left upper outer quadrant wide excision with ultrasound wire localization.  Operating Surgeon: Hervey Ard, MD.  Anesthesia: General by LMA, Marcaine 0.5% with 1 to 200,000 units of epinephrine, 30 cc.  Estimated blood loss: Less than 2 cc.  Clinical note: This 52 year old woman had an abnormal mammogram last fall and stereotactic biopsy showed columnar cell changes with sclerosing adenosis and calcifications.  No atypia.  Six-month follow-up study showed progressive changes and it was felt appropriate to proceed to formal excision.  The patient underwent wire localization prior to the procedure.  Operative note: The patient underwent general anesthesia without difficulty.  The area of the breast chest and axilla was cleansed with ChloraPrep and draped.  Ultrasound was used to identify the track of the localizing wire in the upper outer quadrant of the breast.  Marcaine was infiltrated for postoperative analgesia away from the planned incision.  The skin was incised sharply and extended down through the adipose tissue with electrocautery.  A small area was made for seating of an atrium wound protector.  This provided excellent exposure.  The localizing wire was brought into the field and a block of tissue measuring 3 x 5 5 5  cm including the pectoralis fascia was removed, orientated and sent for specimen radiograph.  The intact wire tip and the previously placed clip were in the specimen.  The deep tissue was approximated with interrupted 2-0 Vicryl figure-of-eight sutures.  The superficial layer of breast tissue was approximated in a similar fashion.  The adipose layer was approximated with interrupted 2-0 Vicryl simple sutures.  The skin was closed with a running 4-0 Vicryl subcuticular suture.  Benzoin, Steri-Strips, and a Telfa pad followed by fluff gauze and a compressive wrap were  applied.  The patient tolerated the procedure well and was taken to recovery room in stable condition.

## 2017-12-25 NOTE — Transfer of Care (Signed)
Immediate Anesthesia Transfer of Care Note  Patient: Kim Morrison  Procedure(s) Performed: BREAST BIOPSY WITH NEEDLE LOCALIZATION (Left Breast)  Patient Location: PACU  Anesthesia Type:General  Level of Consciousness: drowsy and patient cooperative  Airway & Oxygen Therapy: Patient Spontanous Breathing and Patient connected to face mask oxygen  Post-op Assessment: Report given to RN, Post -op Vital signs reviewed and stable and Patient moving all extremities  Post vital signs: Reviewed and stable  Last Vitals:  Vitals Value Taken Time  BP 115/82 12/25/2017  1:13 PM  Temp    Pulse 83 12/25/2017  1:13 PM  Resp 7 12/25/2017  1:13 PM  SpO2 99 % 12/25/2017  1:13 PM  Vitals shown include unvalidated device data.  Last Pain:  Vitals:   12/25/17 1021  TempSrc: Temporal  PainSc: 0-No pain         Complications: No apparent anesthesia complications

## 2017-12-25 NOTE — Anesthesia Procedure Notes (Signed)
Procedure Name: LMA Insertion Performed by: Aymara Sassi, CRNA Pre-anesthesia Checklist: Patient identified, Patient being monitored, Timeout performed, Emergency Drugs available and Suction available Patient Re-evaluated:Patient Re-evaluated prior to induction Oxygen Delivery Method: Circle system utilized Preoxygenation: Pre-oxygenation with 100% oxygen Induction Type: IV induction LMA: LMA inserted LMA Size: 4.5 Tube type: Oral Number of attempts: 1 Placement Confirmation: positive ETCO2 and breath sounds checked- equal and bilateral Tube secured with: Tape Dental Injury: Teeth and Oropharynx as per pre-operative assessment        

## 2017-12-26 ENCOUNTER — Encounter: Payer: Self-pay | Admitting: General Surgery

## 2017-12-26 NOTE — Anesthesia Postprocedure Evaluation (Signed)
Anesthesia Post Note  Patient: Kim Morrison  Procedure(s) Performed: BREAST BIOPSY WITH NEEDLE LOCALIZATION (Left Breast)  Patient location during evaluation: PACU Anesthesia Type: General Level of consciousness: awake and alert Pain management: pain level controlled Vital Signs Assessment: post-procedure vital signs reviewed and stable Respiratory status: spontaneous breathing, nonlabored ventilation, respiratory function stable and patient connected to nasal cannula oxygen Cardiovascular status: blood pressure returned to baseline and stable Postop Assessment: no apparent nausea or vomiting Anesthetic complications: no     Last Vitals:  Vitals:   12/25/17 1435 12/25/17 1554  BP: (!) 147/90 132/75  Pulse: 72 60  Resp: 16 16  Temp: (!) 35.7 C (!) 36.1 C  SpO2:  99%    Last Pain:  Vitals:   12/25/17 1554  TempSrc: Temporal  PainSc: 0-No pain                 Alphonsus Sias

## 2017-12-27 ENCOUNTER — Telehealth: Payer: Self-pay | Admitting: General Surgery

## 2017-12-27 LAB — SURGICAL PATHOLOGY

## 2017-12-27 NOTE — Telephone Encounter (Signed)
The patient was notified that the biopsy results from December 25, 2017 were benign.  She reports doing well.  Has not required any narcotic analgesics.  Follow-up as previously scheduled on January 02, 2018.

## 2018-01-02 ENCOUNTER — Encounter: Payer: Self-pay | Admitting: General Surgery

## 2018-01-02 ENCOUNTER — Ambulatory Visit (INDEPENDENT_AMBULATORY_CARE_PROVIDER_SITE_OTHER): Payer: Managed Care, Other (non HMO) | Admitting: General Surgery

## 2018-01-02 VITALS — BP 130/70 | HR 81 | Resp 14 | Ht 67.0 in | Wt 181.0 lb

## 2018-01-02 DIAGNOSIS — D242 Benign neoplasm of left breast: Secondary | ICD-10-CM

## 2018-01-02 NOTE — Patient Instructions (Addendum)
The patient is aware to call back for any questions or concerns. The patient will be asked to return to the office in six months with a bilateral screening mammogram.

## 2018-01-02 NOTE — Progress Notes (Signed)
Patient ID: Kim Morrison, female   DOB: Mar 13, 1966, 52 y.o.   MRN: [id number]  Chief Complaint  Patient presents with  . Routine Post Op    HPI Kim Morrison is a 52 y.o. female.  Here today for postoperative left breast biopsy on 12-25-17, she states she is doing well.  HPI  Past Medical History:  Diagnosis Date  . GERD (gastroesophageal reflux disease)    OCC   . Hypertension     Past Surgical History:  Procedure Laterality Date  . ABDOMINAL HYSTERECTOMY    . BREAST BIOPSY Left 03/06/2011   negative  . BREAST BIOPSY Left 05/30/2017   COLUMNAR CELL CHANGE AND SCLEROSING ADENOSIS WITH ASSOCIATED   . BREAST BIOPSY Left 12/25/2017   Procedure: BREAST BIOPSY WITH NEEDLE LOCALIZATION;  Surgeon: Robert Bellow, MD;  Location: [facility] ORS;  Service: General;  Laterality: Left;    Family History  Problem Relation Age of Onset  . Breast cancer Neg Hx     Social History Social History   Tobacco Use  . Smoking status: Never Smoker  . Smokeless tobacco: Never Used  Substance Use Topics  . Alcohol use: Yes    Frequency: Never    Comment: RARE  . Drug use: Never    No Known Allergies  Current Outpatient Medications  Medication Sig Dispense Refill  . bisoprolol-hydrochlorothiazide (ZIAC) 2.5-6.25 MG tablet take 1 tablet by mouth once daily-NOON    . esomeprazole (NEXIUM) 20 MG capsule Take 20 mg by mouth as needed.    . fexofenadine-pseudoephedrine (ALLEGRA-D ALLERGY & CONGESTION) 60-120 MG 12 hr tablet Take 1 tablet by mouth 2 (two) times daily. PRN allergies (Patient taking differently: Take 1 tablet by mouth 2 (two) times daily as needed (allergies). P) 60 tablet 0  . fluticasone (FLONASE) 50 MCG/ACT nasal spray 1-2 sprays in each nostril daily (Patient taking differently: Place 1-2 sprays into both nostrils daily as needed for allergies. ) 16 g 0  . Multiple Vitamin (MULTIVITAMIN WITH MINERALS) TABS tablet Take 1 tablet by mouth every other day.    . naproxen  sodium (ALEVE) 220 MG tablet Take 220 mg by mouth daily as needed (pain).    . Omega-3 Fatty Acids (OMEGA 3 PO) Take 1 capsule by mouth once a week.    . Tetrahydrozoline HCl (VISINE OP) Place 1 drop into both eyes daily as needed (allergies).     No current facility-administered medications for this visit.     Review of Systems Review of Systems  Constitutional: Negative.   Respiratory: Negative.   Cardiovascular: Negative.     Blood pressure 130/70, pulse 81, resp. rate 14, height 5\' 7"  (1.702 m), weight 181 lb (82.1 kg).  Physical Exam Physical Exam  Constitutional: She is oriented to person, place, and time. She appears well-developed and well-nourished.  Pulmonary/Chest:    Neurological: She is alert and oriented to person, place, and time.  Skin: Skin is warm and dry.  Psychiatric: Her behavior is normal.    Data Reviewed DIAGNOSIS:  A. LEFT BREAST, UPPER OUTER QUADRANT; NEEDLE LOCALIZED WIDE EXCISION:  - FIBROCYSTIC CHANGES WITH MICROCALCIFICATIONS.  - COLUMNAR CELL CHANGE WITH CALCIFICATIONS.  - INTRADUCTAL PAPILLOMA WITH FOCAL SCLEROSIS, 10 MM.  - FIBROADENOMA, 7 MM.  - NEGATIVE FOR ATYPIA AND MALIGNANCY.   Assessment    Doing well status post formal excision of an deviously biopsied area of abnormal tissue in the left breast.  Plan    The patient will be asked to  return to the office in six months with a bilateral screening mammogram.       HPI, Physical Exam, Assessment and Plan have been scribed under the direction and in the presence of Robert Bellow, MD. Karie Fetch, RN  I have completed the exam and reviewed the above documentation for accuracy and completeness.  I agree with the above.  Haematologist has been used and any errors in dictation or transcription are unintentional.  Hervey Ard, M.D., F.A.C.S.  Forest Gleason Letrell Attwood 01/03/2018, 5:40 PM

## 2018-01-03 DIAGNOSIS — D242 Benign neoplasm of left breast: Secondary | ICD-10-CM | POA: Insufficient documentation

## 2018-04-18 ENCOUNTER — Telehealth: Payer: Self-pay | Admitting: General Surgery

## 2018-04-18 NOTE — Telephone Encounter (Signed)
Patient is calling about her mammogram appointment, patient said she normally has one done with her obgyn in December and the patient is asking can we go by the one she has done in December or will the patient have to have another one done in January with Dr. Bary Castilla. Please call patient and advise.

## 2018-04-21 NOTE — Telephone Encounter (Signed)
Spoke with patient and she is going to have her mammograms through her Ob/GYN in December. I let her know that was fine. We will just schedule her for a follow up appointment in January with Dr Bary Castilla.

## 2018-04-28 ENCOUNTER — Other Ambulatory Visit: Payer: Self-pay | Admitting: General Surgery

## 2018-04-28 DIAGNOSIS — Z1231 Encounter for screening mammogram for malignant neoplasm of breast: Secondary | ICD-10-CM

## 2018-05-27 ENCOUNTER — Ambulatory Visit
Admission: RE | Admit: 2018-05-27 | Discharge: 2018-05-27 | Disposition: A | Discharge status: Home / Self Care | Payer: Managed Care, Other (non HMO) | Source: Ambulatory Visit | Attending: General Surgery | Admitting: General Surgery

## 2018-05-27 DIAGNOSIS — Z1231 Encounter for screening mammogram for malignant neoplasm of breast: Secondary | ICD-10-CM | POA: Diagnosis present

## 2018-06-19 ENCOUNTER — Ambulatory Visit: Payer: Managed Care, Other (non HMO) | Admitting: General Surgery

## 2018-07-17 ENCOUNTER — Encounter: Payer: Self-pay | Admitting: General Surgery

## 2018-07-17 ENCOUNTER — Ambulatory Visit: Payer: Managed Care, Other (non HMO) | Admitting: General Surgery

## 2018-07-17 ENCOUNTER — Other Ambulatory Visit: Payer: Self-pay

## 2018-07-17 VITALS — BP 139/91 | HR 64 | Temp 97.2°F | Ht 67.0 in | Wt 181.2 lb

## 2018-07-17 DIAGNOSIS — D242 Benign neoplasm of left breast: Secondary | ICD-10-CM | POA: Diagnosis not present

## 2018-07-17 NOTE — Progress Notes (Signed)
Patient ID: Kim Morrison, female   DOB: May 31, 1966, 53 y.o.   MRN: [id number]  Chief Complaint  Patient presents with  . Follow-up    f/u rec Bil Screen mammo [facility] 05/27/18    HPI Kim Morrison is a 53 y.o. female.  who presents for a breast evaluation. The most recent mammogram was done on 05-27-18. Patient does perform regular self breast checks and gets regular mammograms done.   No new breast issues.  The patient reports that 2 weeks after her surgery she had spontaneous drainage of purulent fluid and the wound promptly closed and healed without further incident.  This had not been reported until today.  At this time she is having no discomfort in the breast.  HPI  Past Medical History:  Diagnosis Date  . GERD (gastroesophageal reflux disease)    OCC   . Hypertension     Past Surgical History:  Procedure Laterality Date  . ABDOMINAL HYSTERECTOMY    . BREAST BIOPSY Left 03/06/2011   negative  . BREAST BIOPSY Left 05/30/2017   COLUMNAR CELL CHANGE AND SCLEROSING ADENOSIS WITH ASSOCIATED   . BREAST BIOPSY Left 12/25/2017   Procedure: BREAST BIOPSY WITH NEEDLE LOCALIZATION;  Surgeon: Robert Bellow, MD;  Location: [facility] ORS;  Service: General;  Laterality: Left;  . BREAST EXCISIONAL BIOPSY Left 12/25/2017   fibroadenoma, intraductal papilloma     Family History  Problem Relation Age of Onset  . Breast cancer Neg Hx     Social History Social History   Tobacco Use  . Smoking status: Never Smoker  . Smokeless tobacco: Never Used  Substance Use Topics  . Alcohol use: Yes    Frequency: Never    Comment: RARE  . Drug use: Never    No Known Allergies  Current Outpatient Medications  Medication Sig Dispense Refill  . bisoprolol-hydrochlorothiazide (ZIAC) 2.5-6.25 MG tablet take 1 tablet by mouth once daily-NOON    . esomeprazole (NEXIUM) 20 MG capsule Take 20 mg by mouth as needed.    . fexofenadine-pseudoephedrine (ALLEGRA-D ALLERGY & CONGESTION) 60-120  MG 12 hr tablet Take 1 tablet by mouth 2 (two) times daily. PRN allergies (Patient taking differently: Take 1 tablet by mouth 2 (two) times daily as needed (allergies). P) 60 tablet 0  . fluticasone (FLONASE) 50 MCG/ACT nasal spray 1-2 sprays in each nostril daily (Patient taking differently: Place 1-2 sprays into both nostrils daily as needed for allergies. ) 16 g 0  . Multiple Vitamin (MULTIVITAMIN WITH MINERALS) TABS tablet Take 1 tablet by mouth every other day.    . naproxen sodium (ALEVE) 220 MG tablet Take 220 mg by mouth daily as needed (pain).    . Omega-3 Fatty Acids (OMEGA 3 PO) Take 1 capsule by mouth once a week.    . Tetrahydrozoline HCl (VISINE OP) Place 1 drop into both eyes daily as needed (allergies).     No current facility-administered medications for this visit.     Review of Systems Review of Systems  Constitutional: Negative.   Respiratory: Negative.   Cardiovascular: Negative.     Blood pressure (!) 139/91, pulse 64, temperature (!) 97.2 F (36.2 C), temperature source Temporal, height 5\' 7"  (1.702 m), weight 181 lb 3.2 oz (82.2 kg), SpO2 98 %.  Physical Exam Physical Exam Exam conducted with a chaperone present.  Constitutional:      Appearance: She is well-developed.  Eyes:     General: No scleral icterus.    Conjunctiva/sclera: Conjunctivae normal.  Neck:     Musculoskeletal: Neck supple.  Cardiovascular:     Rate and Rhythm: Normal rate and regular rhythm.     Heart sounds: Normal heart sounds.  Pulmonary:     Effort: Pulmonary effort is normal.     Breath sounds: Normal breath sounds.  Chest:     Breasts:        Right: No inverted nipple, mass, nipple discharge, skin change or tenderness.        Left: No inverted nipple, mass, nipple discharge, skin change or tenderness.    Lymphadenopathy:     Cervical: No cervical adenopathy.     Upper Body:     Right upper body: No axillary adenopathy.     Left upper body: No axillary adenopathy.  Skin:     General: Skin is warm and dry.  Neurological:     Mental Status: She is alert and oriented to person, place, and time.  Psychiatric:        Behavior: Behavior normal.     Data Reviewed Bilateral screening mammograms dated May 27, 2018 were reviewed.  Minimal architectural distortion under the surgical incision site.  December 25, 2017 operative biopsy report: DIAGNOSIS:  A. LEFT BREAST, UPPER OUTER QUADRANT; NEEDLE LOCALIZED WIDE EXCISION:  - FIBROCYSTIC CHANGES WITH MICROCALCIFICATIONS.  - COLUMNAR CELL CHANGE WITH CALCIFICATIONS.  - INTRADUCTAL PAPILLOMA WITH FOCAL SCLEROSIS, 10 MM.  - FIBROADENOMA, 7 MM.  - NEGATIVE FOR ATYPIA AND MALIGNANCY.   The patient had a follow-up mammogram in between the 2 biopsies and the radiologist had recommended excision of the area of architectural distortion.  Results as noted above.  May 30, 2017 core needle biopsy: A. BREAST, LEFT, UPPER OUTER QUADRANT; STEREOTACTIC-GUIDED CORE BIOPSY:  - COLUMNAR CELL CHANGE AND SCLEROSING ADENOSIS WITH ASSOCIATED   Assessment    Doing well post excision of a area of columnar cell changes and a small papilloma and fibroadenoma.    Plan    The patient is encouraged to continue annual screening mammograms with Dr. Leafy Ro.  Follow-up here will be on an as-needed basis.    She is welcome to return at any time should there be new mammographic or palpable abnormality detected.     HPI and physical exam has been scribed under the direction and in the presence of Robert Bellow, MD. Karie Fetch, RN  Forest Gleason Kyria Bumgardner 07/17/2018, 12:30 PM

## 2018-07-17 NOTE — Patient Instructions (Addendum)
The patient is aware to call back for any questions or new concerns. Follow up as needed or new issues

## 2019-02-12 ENCOUNTER — Other Ambulatory Visit: Payer: Self-pay

## 2019-02-12 ENCOUNTER — Ambulatory Visit: Payer: Managed Care, Other (non HMO) | Admitting: Adult Health

## 2019-02-12 ENCOUNTER — Encounter: Payer: Self-pay | Admitting: Adult Health

## 2019-02-12 VITALS — BP 118/86 | HR 73 | Temp 98.3°F | Resp 16 | Ht 67.0 in | Wt 184.0 lb

## 2019-02-12 DIAGNOSIS — Z008 Encounter for other general examination: Secondary | ICD-10-CM

## 2019-02-12 DIAGNOSIS — Z0189 Encounter for other specified special examinations: Secondary | ICD-10-CM

## 2019-02-12 DIAGNOSIS — I10 Essential (primary) hypertension: Secondary | ICD-10-CM | POA: Insufficient documentation

## 2019-02-12 NOTE — Patient Instructions (Addendum)
I will have the office call you on your glucose and cholesterol results when they return if you have not heard within 1 week please call the office.  This biometric physical is a brief physical and the only labs done are glucose and your lipid panel(cholesterol) and is  not a substitute for seeing a primary care provider for a complete annual physical. Please see a primary care physician for routine health maintenance, labs and full physical at least yearly and follow up as recommended by your provider. Provider also recommends if you do not have a primary care provider for patient to establish care as soon as possible .Patient may chose provider of choice. Also gave the [place] at [phone] or web site at [email or web address] to help assist with finding a primary care doctor.  Patient verbalizes understanding that his office is acute care only and not a substitute for a primary care or for the management of chronic conditions.    Medications Discontinued During This Encounter  Medication Reason  . fexofenadine-pseudoephedrine (ALLEGRA-D ALLERGY & CONGESTION) 60-120 MG 12 hr tablet Completed Course    Health Maintenance, Female Adopting a healthy lifestyle and getting preventive care are important in promoting health and wellness. Ask your health care provider about:  The right schedule for you to have regular tests and exams.  Things you can do on your own to prevent diseases and keep yourself healthy. What should I know about diet, weight, and exercise? Eat a healthy diet   Eat a diet that includes plenty of vegetables, fruits, low-fat dairy products, and lean protein.  Do not eat a lot of foods that are high in solid fats, added sugars, or sodium. Maintain a healthy weight Body mass index (BMI) is used to identify weight problems. It estimates body fat based on height and weight. Your health care provider can help determine your BMI and help  you achieve or maintain a healthy weight. Get regular exercise Get regular exercise. This is one of the most important things you can do for your health. Most adults should:  Exercise for at least 150 minutes each week. The exercise should increase your heart rate and make you sweat (moderate-intensity exercise).  Do strengthening exercises at least twice a week. This is in addition to the moderate-intensity exercise.  Spend less time sitting. Even light physical activity can be beneficial. Watch cholesterol and blood lipids Have your blood tested for lipids and cholesterol at 51 years of age, then have this test every 5 years. Have your cholesterol levels checked more often if:  Your lipid or cholesterol levels are high.  You are older than 51 years of age.  You are at high risk for heart disease. What should I know about cancer screening? Depending on your health history and family history, you may need to have cancer screening at various ages. This may include screening for:  Breast cancer.  Cervical cancer.  Colorectal cancer.  Skin cancer.  Lung cancer. What should I know about heart disease, diabetes, and high blood pressure? Blood pressure and heart disease  High blood pressure causes heart disease and increases the risk of stroke. This is more likely to develop in people who have high blood pressure readings, are of African descent, or are overweight.  Have your blood pressure checked: ? Every 3-5 years if you are 51-72 years of age. ? Every year if you are 51 years old or older. Diabetes Have  regular diabetes screenings. This checks your fasting blood sugar level. Have the screening done:  Once every three years after age 51 if you are at a normal weight and have a low risk for diabetes.  More often and at a younger age if you are overweight or have a high risk for diabetes. What should I know about preventing infection? Hepatitis B If you have a higher risk for  hepatitis B, you should be screened for this virus. Talk with your health care provider to find out if you are at risk for hepatitis B infection. Hepatitis C Testing is recommended for:  Everyone born from [birth year] through 1963.  Anyone with known risk factors for hepatitis C. Sexually transmitted infections (STIs)  Get screened for STIs, including gonorrhea and chlamydia, if: ? You are sexually active and are younger than 51 years of age. ? You are older than 51 years of age and your health care provider tells you that you are at risk for this type of infection. ? Your sexual activity has changed since you were last screened, and you are at increased risk for chlamydia or gonorrhea. Ask your health care provider if you are at risk.  Ask your health care provider about whether you are at high risk for HIV. Your health care provider may recommend a prescription medicine to help prevent HIV infection. If you choose to take medicine to prevent HIV, you should first get tested for HIV. You should then be tested every 3 months for as long as you are taking the medicine. Pregnancy  If you are about to stop having your period (premenopausal) and you may become pregnant, seek counseling before you get pregnant.  Take 400 to 800 micrograms (mcg) of folic acid every day if you become pregnant.  Ask for birth control (contraception) if you want to prevent pregnancy. Osteoporosis and menopause Osteoporosis is a disease in which the bones lose minerals and strength with aging. This can result in bone fractures. If you are 74 years old or older, or if you are at risk for osteoporosis and fractures, ask your health care provider if you should:  Be screened for bone loss.  Take a calcium or vitamin D supplement to lower your risk of fractures.  Be given hormone replacement therapy (HRT) to treat symptoms of menopause. Follow these instructions at home: Lifestyle  Do not use any products that contain  nicotine or tobacco, such as cigarettes, e-cigarettes, and chewing tobacco. If you need help quitting, ask your health care provider.  Do not use street drugs.  Do not share needles.  Ask your health care provider for help if you need support or information about quitting drugs. Alcohol use  Do not drink alcohol if: ? Your health care provider tells you not to drink. ? You are pregnant, may be pregnant, or are planning to become pregnant.  If you drink alcohol: ? Limit how much you use to 0-1 drink a day. ? Limit intake if you are breastfeeding.  Be aware of how much alcohol is in your drink. In the [place] one drink equals one 12 oz bottle of beer (355 mL), one 5 oz glass of wine (148 mL), or one 1 oz glass of hard liquor (44 mL). General instructions  Schedule regular health, dental, and eye exams.  Stay current with your vaccines.  Tell your health care provider if: ? You often feel depressed. ? You have ever been abused or do not feel safe at home.  Summary  Adopting a healthy lifestyle and getting preventive care are important in promoting health and wellness.  Follow your health care provider's instructions about healthy diet, exercising, and getting tested or screened for diseases.  Follow your health care provider's instructions on monitoring your cholesterol and blood pressure. This information is not intended to replace advice given to you by your health care provider. Make sure you discuss any questions you have with your health care provider. Document Released: 12/11/2010 Document Revised: 05/21/2018 Document Reviewed: 05/21/2018 Elsevier Patient Education  2020 [employer].

## 2019-02-12 NOTE — Progress Notes (Addendum)
Kim Morrison DOB: 53 y.o. MRN: [id number]  Subjective:  Here for Biometric Screen/brief exam Patient is a 54 year old female in no acute distress, she comes to the clinic for a biometric screening and brief biometric exam.  She reports she is doing well.  She does not exercise.  She works in the [employer]. Currently this is stressful because there is a pandemic of COVID-19 in the jail.  She currently has a pending COVID-19 test but due to she is being tested weekly per protocol at the jail currently.  She denies any symptoms.  She currently denies any health concerns.  She takes Allegra with decongestant most every day for the past 2 years.  This is discussed in her plan.  Patient  denies any fever, body aches,chills, rash, chest pain, shortness of breath, nausea, vomiting, or diarrhea.   Objective: Blood pressure 118/86, pulse 73, temperature 98.3 F (36.8 C), temperature source Temporal, resp. rate 16, height 5\' 7"  (1.702 m), weight 184 lb (83.5 kg), SpO2 98 %. NAD, well-developed well-nourished HEENT: Within normal limits Neck: Normal, no cervical lymphadenopathy, supple Heart: Regular rate and rhythm without murmurs rubs or gallops Lungs: Clear to auscultation without any adventitious lung sounds  Assessment: Biometric screen 1. Encounter for other general examination-not full annual physical only brief exam with biometric screening   2. Encounter for biometric screening      Plan:  Medications Discontinued During This Encounter  Medication Reason  . fexofenadine-pseudoephedrine (ALLEGRA-D ALLERGY & CONGESTION) 60-120 MG 12 hr tablet Completed Course    Advised patient she should not do Allegra with decongestant, she should take plain Allegra only if needed for allergies.  She does have a history of hypertension and is on hypertension medications discussed the contraindications of decongestants with hypertension and  long-term side effects.  She should follow-up for an annual eye exam with her eye doctor she says she is due. Her primary care provider is Benjaman Kindler she reports and she will follow-up with her for yearly physical.  Fasting glucose and lipids. Discussed with patient that today's visit here is a limited biometric screening visit (not a comprehensive exam or management of any chronic problems) Discussed some health issues, including healthy eating habits and exercise. Encouraged to follow-up with PCP for annual comprehensive preventive and wellness care (and if applicable, any chronic issues). Questions invited and answered.  I will have the office call you on your glucose and cholesterol results when they return if you have not heard within 1 week please call the office.  This biometric physical is a brief physical and the only labs done are glucose and your lipid panel(cholesterol) and is  not a substitute for seeing a primary care provider for a complete annual physical. Please see a primary care physician for routine health maintenance, labs and full physical at least yearly and follow up as recommended by your provider. Provider also recommends if you do not have a primary care provider for patient to establish care as soon as possible .Patient may chose provider of choice. Also gave the [place] at [phone] or web site at [email or web address] to help assist with finding a primary care doctor.  Patient verbalizes understanding that his office is acute care only and not a substitute for a primary care or for the management of chronic conditions.

## 2019-02-13 ENCOUNTER — Encounter: Payer: Self-pay | Admitting: Adult Health

## 2019-02-13 LAB — LIPID PANEL WITH LDL/HDL RATIO
Cholesterol, Total: 185 mg/dL (ref 100–199)
HDL: 56 mg/dL (ref >39)
LDL Chol Calc (NIH): 115 mg/dL — ABNORMAL HIGH (ref 0–99)
LDL/HDL Ratio: 2.1 ratio (ref 0.0–3.2)
Triglycerides: 75 mg/dL (ref 0–149)
VLDL Cholesterol Cal: 14 mg/dL (ref 5–40)

## 2019-02-13 LAB — GLUCOSE, RANDOM: Glucose: 77 mg/dL (ref 65–99)

## 2019-03-04 ENCOUNTER — Telehealth: Payer: Self-pay

## 2019-03-04 NOTE — Telephone Encounter (Signed)
The patient was contacted in regards to her results and recommendations from her Biometric physical. She was notified that a message was also sent to her MyChart to review. She gave verbal  Understanding.

## 2019-04-07 ENCOUNTER — Other Ambulatory Visit: Payer: Self-pay | Admitting: Obstetrics and Gynecology

## 2019-04-07 DIAGNOSIS — Z1231 Encounter for screening mammogram for malignant neoplasm of breast: Secondary | ICD-10-CM

## 2019-05-29 ENCOUNTER — Ambulatory Visit
Admission: RE | Admit: 2019-05-29 | Discharge: 2019-05-29 | Disposition: A | Discharge status: Home / Self Care | Payer: Managed Care, Other (non HMO) | Source: Ambulatory Visit | Attending: Obstetrics and Gynecology | Admitting: Obstetrics and Gynecology

## 2019-05-29 ENCOUNTER — Other Ambulatory Visit: Payer: Self-pay

## 2019-05-29 DIAGNOSIS — Z1231 Encounter for screening mammogram for malignant neoplasm of breast: Secondary | ICD-10-CM | POA: Insufficient documentation

## 2020-03-30 ENCOUNTER — Encounter: Payer: Self-pay | Admitting: Physician Assistant

## 2020-03-30 ENCOUNTER — Ambulatory Visit: Payer: Managed Care, Other (non HMO) | Admitting: Physician Assistant

## 2020-03-30 VITALS — BP 140/82 | HR 78 | Temp 98.5°F | Resp 16 | Ht 67.0 in | Wt 180.0 lb

## 2020-03-30 DIAGNOSIS — Z0000 Encounter for general adult medical examination without abnormal findings: Secondary | ICD-10-CM | POA: Diagnosis not present

## 2020-03-30 DIAGNOSIS — Z008 Encounter for other general examination: Secondary | ICD-10-CM

## 2020-03-30 NOTE — Patient Instructions (Addendum)
We discussed the need the need for monitoring weight and weight reduction. We discussed the need for low cholesterol diet. Schedule time for self. Return to clinic in 1 year.
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
## 2020-03-31 ENCOUNTER — Encounter: Payer: Self-pay | Admitting: Physician Assistant

## 2020-03-31 LAB — LIPID PANEL
Chol/HDL Ratio: 3.4 ratio (ref 0.0–4.4)
Cholesterol, Total: 195 mg/dL (ref 100–199)
HDL: 57 mg/dL (ref >39)
LDL Chol Calc (NIH): 124 mg/dL — ABNORMAL HIGH (ref 0–99)
Triglycerides: 76 mg/dL (ref 0–149)
VLDL Cholesterol Cal: 14 mg/dL (ref 5–40)

## 2020-03-31 LAB — GLUCOSE, RANDOM: Glucose: 90 mg/dL (ref 65–99)

## 2020-06-14 ENCOUNTER — Other Ambulatory Visit: Payer: Self-pay | Admitting: Obstetrics and Gynecology

## 2020-06-14 DIAGNOSIS — Z1231 Encounter for screening mammogram for malignant neoplasm of breast: Secondary | ICD-10-CM

## 2020-07-15 ENCOUNTER — Other Ambulatory Visit: Payer: Self-pay

## 2020-07-15 ENCOUNTER — Ambulatory Visit
Admission: RE | Admit: 2020-07-15 | Discharge: 2020-07-15 | Disposition: A | Discharge status: Home / Self Care | Payer: Managed Care, Other (non HMO) | Source: Ambulatory Visit | Attending: Obstetrics and Gynecology | Admitting: Obstetrics and Gynecology

## 2020-07-15 DIAGNOSIS — Z1231 Encounter for screening mammogram for malignant neoplasm of breast: Secondary | ICD-10-CM | POA: Insufficient documentation

## 2020-07-21 ENCOUNTER — Other Ambulatory Visit: Payer: Self-pay | Admitting: Obstetrics and Gynecology

## 2020-07-21 DIAGNOSIS — N6310 Unspecified lump in the right breast, unspecified quadrant: Secondary | ICD-10-CM

## 2020-07-21 DIAGNOSIS — R928 Other abnormal and inconclusive findings on diagnostic imaging of breast: Secondary | ICD-10-CM

## 2020-07-29 ENCOUNTER — Ambulatory Visit
Admission: RE | Admit: 2020-07-29 | Discharge: 2020-07-29 | Disposition: A | Discharge status: Home / Self Care | Payer: Managed Care, Other (non HMO) | Source: Ambulatory Visit | Attending: Obstetrics and Gynecology | Admitting: Obstetrics and Gynecology

## 2020-07-29 ENCOUNTER — Other Ambulatory Visit: Payer: Self-pay

## 2020-07-29 DIAGNOSIS — N6310 Unspecified lump in the right breast, unspecified quadrant: Secondary | ICD-10-CM | POA: Insufficient documentation

## 2020-07-29 DIAGNOSIS — R928 Other abnormal and inconclusive findings on diagnostic imaging of breast: Secondary | ICD-10-CM

## 2020-08-01 ENCOUNTER — Other Ambulatory Visit: Payer: Self-pay | Admitting: Obstetrics and Gynecology

## 2020-08-01 DIAGNOSIS — N6310 Unspecified lump in the right breast, unspecified quadrant: Secondary | ICD-10-CM

## 2021-02-10 ENCOUNTER — Other Ambulatory Visit: Payer: Self-pay

## 2021-02-10 ENCOUNTER — Ambulatory Visit
Admission: RE | Admit: 2021-02-10 | Discharge: 2021-02-10 | Disposition: A | Discharge status: Home / Self Care | Payer: Managed Care, Other (non HMO) | Source: Ambulatory Visit | Attending: Obstetrics and Gynecology | Admitting: Obstetrics and Gynecology

## 2021-02-10 DIAGNOSIS — N6310 Unspecified lump in the right breast, unspecified quadrant: Secondary | ICD-10-CM | POA: Diagnosis not present

## 2021-10-02 ENCOUNTER — Other Ambulatory Visit: Payer: Self-pay | Admitting: Obstetrics and Gynecology

## 2021-10-02 DIAGNOSIS — Z1231 Encounter for screening mammogram for malignant neoplasm of breast: Secondary | ICD-10-CM

## 2021-10-06 ENCOUNTER — Ambulatory Visit
Admission: RE | Admit: 2021-10-06 | Discharge: 2021-10-06 | Disposition: A | Discharge status: Home / Self Care | Payer: Managed Care, Other (non HMO) | Source: Ambulatory Visit | Attending: Obstetrics and Gynecology | Admitting: Obstetrics and Gynecology

## 2021-10-06 DIAGNOSIS — Z1231 Encounter for screening mammogram for malignant neoplasm of breast: Secondary | ICD-10-CM | POA: Diagnosis present

## 2021-10-19 IMAGING — US US BREAST*R* LIMITED INC AXILLA
1 series · 6 of 6 positions shown · non-contrast
Comparison: Previous exam(s).

CLINICAL DATA: First six-month follow-up of probable benign right
breast mass at the 5 o'clock position, likely a complicated cyst.

EXAM:
ULTRASOUND OF THE RIGHT BREAST

[Series 1: us breast*right* limited inc axilla · 0.04mm/px · 6 of 6 slices shown]
[im 1/6]
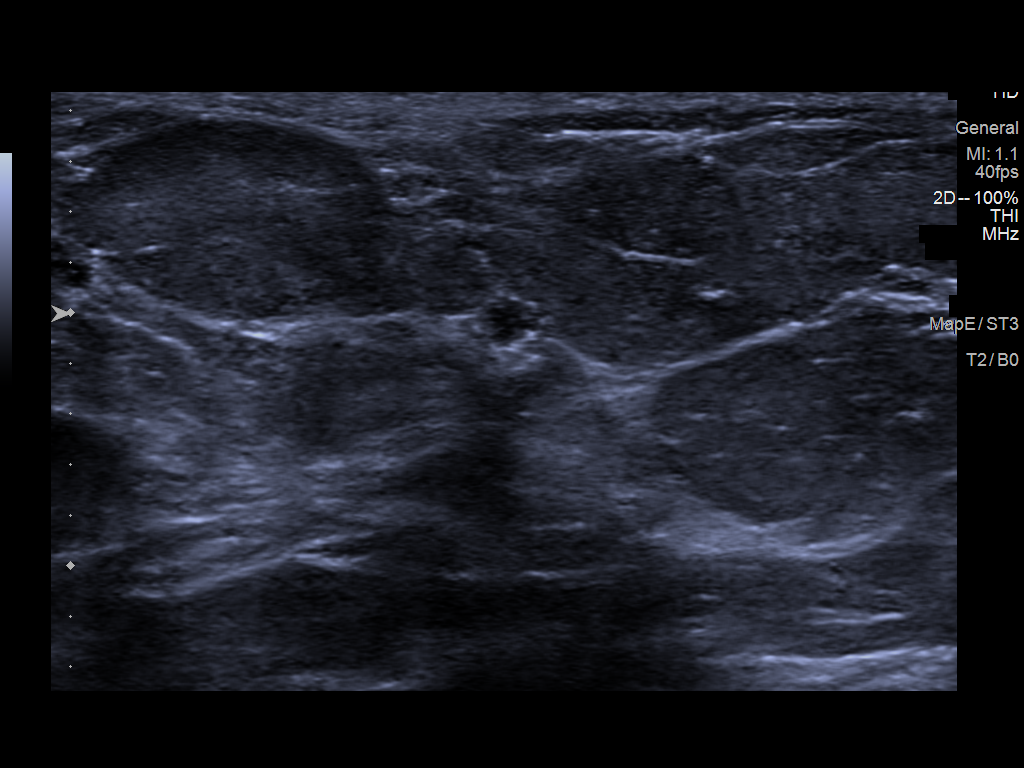
[im 2/6]
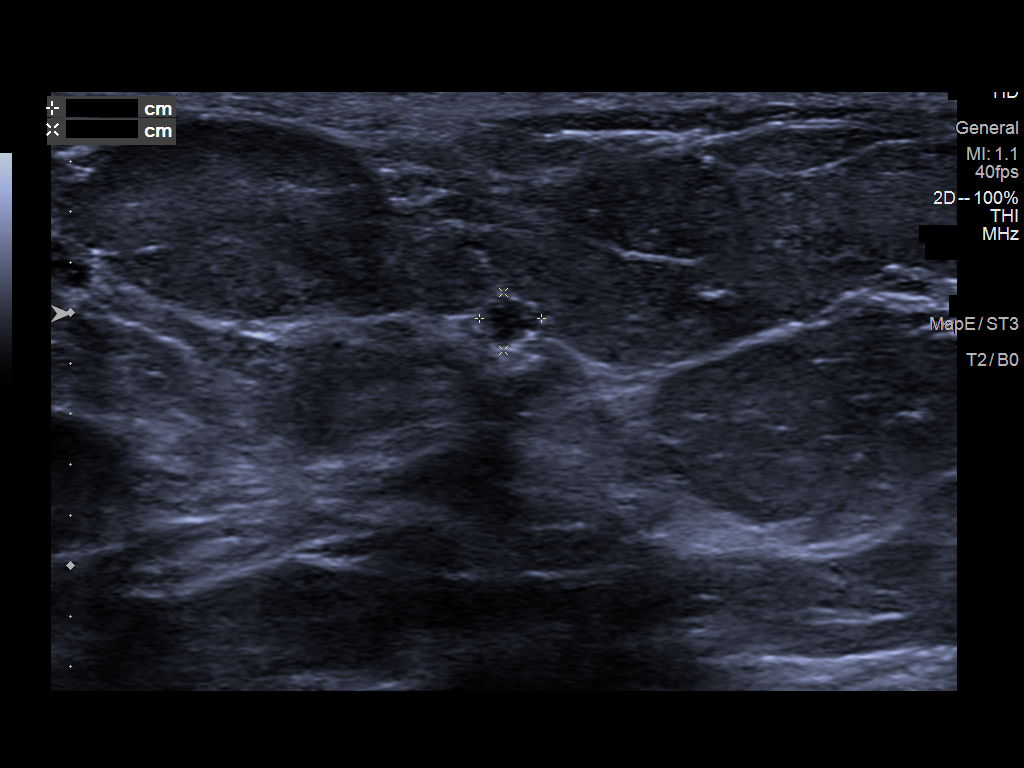
[im 3/6]
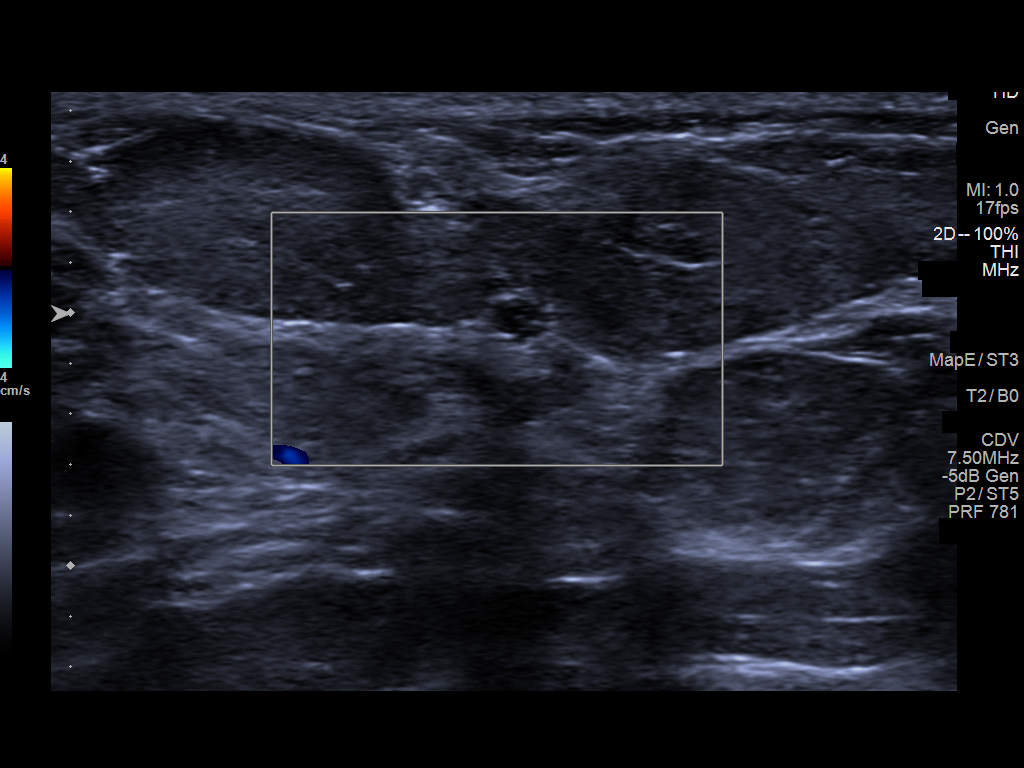
[im 4/6]
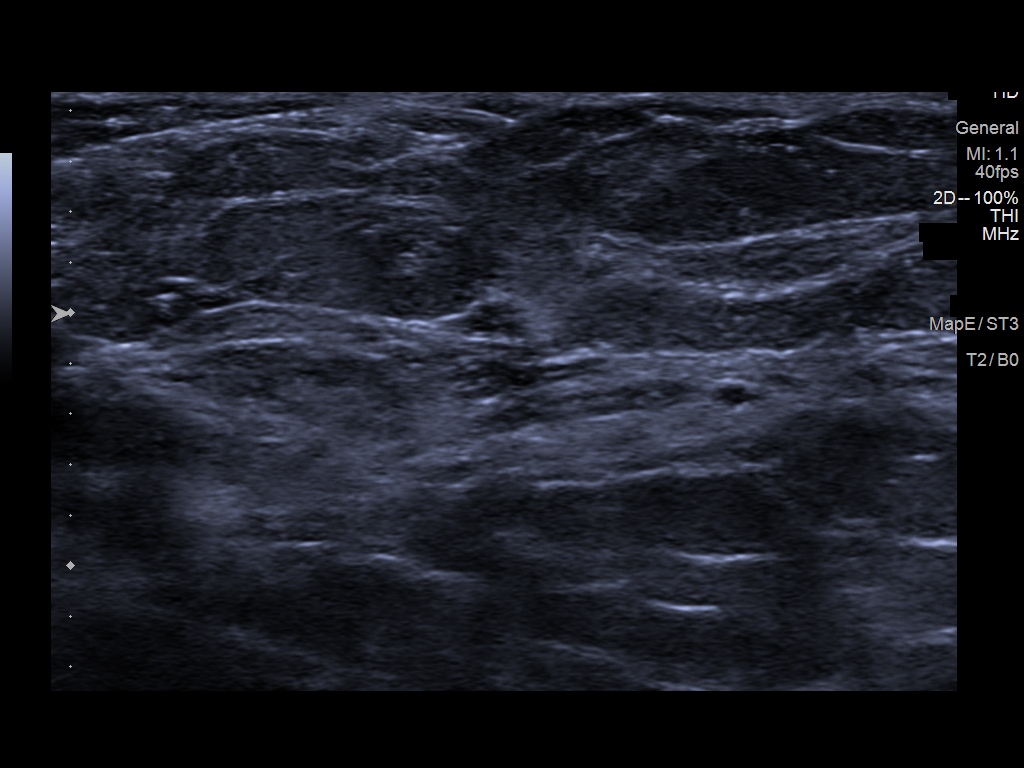
[im 5/6]
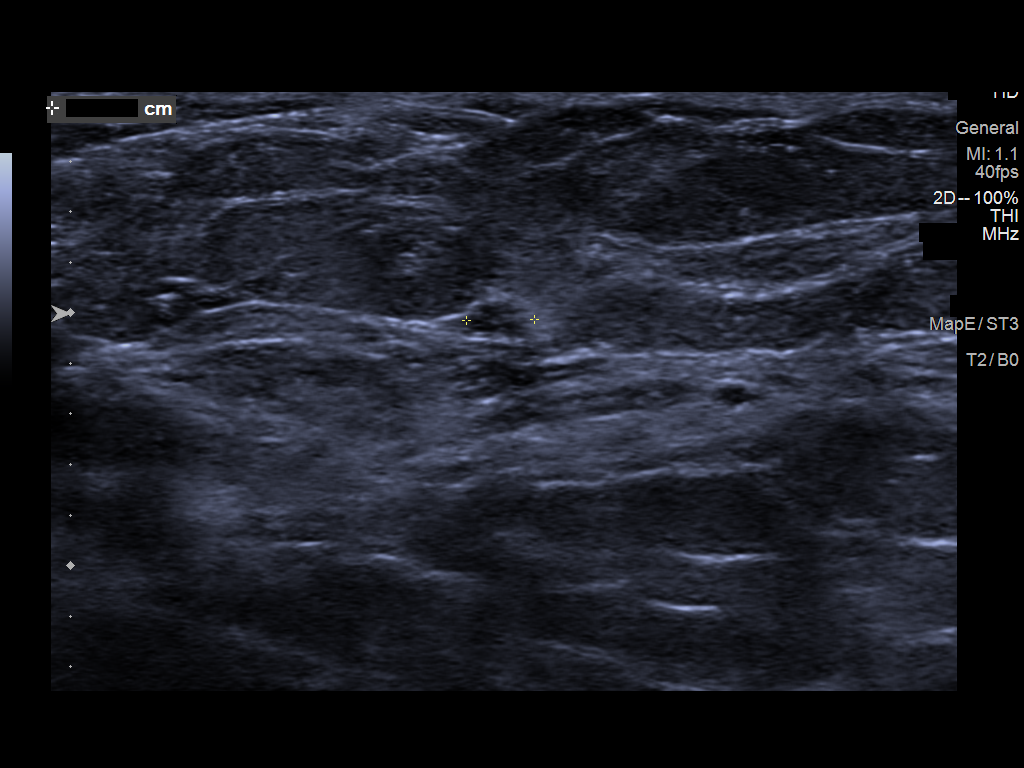
[im 6/6]
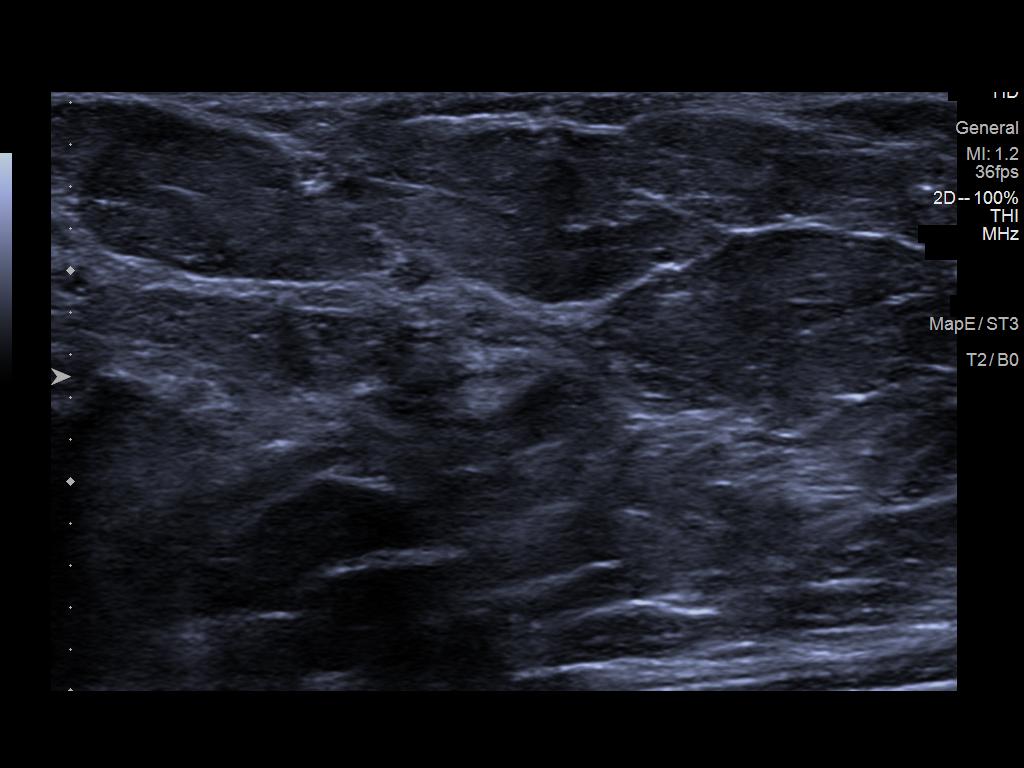

[6 of 6 positions shown; findings below may reference images not displayed]

FINDINGS: Targeted ultrasound of the right breast at the 5 o'clock position 2
cm from the nipple demonstrates a 0.3 x 0.2 x 0.3 cm anechoic round
mass, previously measuring 0.6 x 0.4 x 0.5 cm on 07/29/2020.
IMPRESSION: Interval marked decreased size of the probable complicated cyst at
the right breast 5 o'clock position, consistent with benignity.

RECOMMENDATION:
Annual screening mammogram.

I have discussed the findings and recommendations with the patient.
If applicable, a reminder letter will be sent to the patient
regarding the next appointment.

BI-RADS CATEGORY  2: Benign.

## 2022-06-14 IMAGING — MG MM DIGITAL SCREENING BILAT W/ TOMO AND CAD
8 series · 8 of 24 positions shown · non-contrast
Comparison: Previous exam(s).

CLINICAL DATA: Screening.

EXAM:
DIGITAL SCREENING BILATERAL MAMMOGRAM WITH TOMOSYNTHESIS AND CAD
TECHNIQUE: Bilateral screening digital craniocaudal and mediolateral oblique
mammograms were obtained. Bilateral screening digital breast
tomosynthesis was performed. The images were evaluated with
computer-aided detection.

[R CC synth-2D]
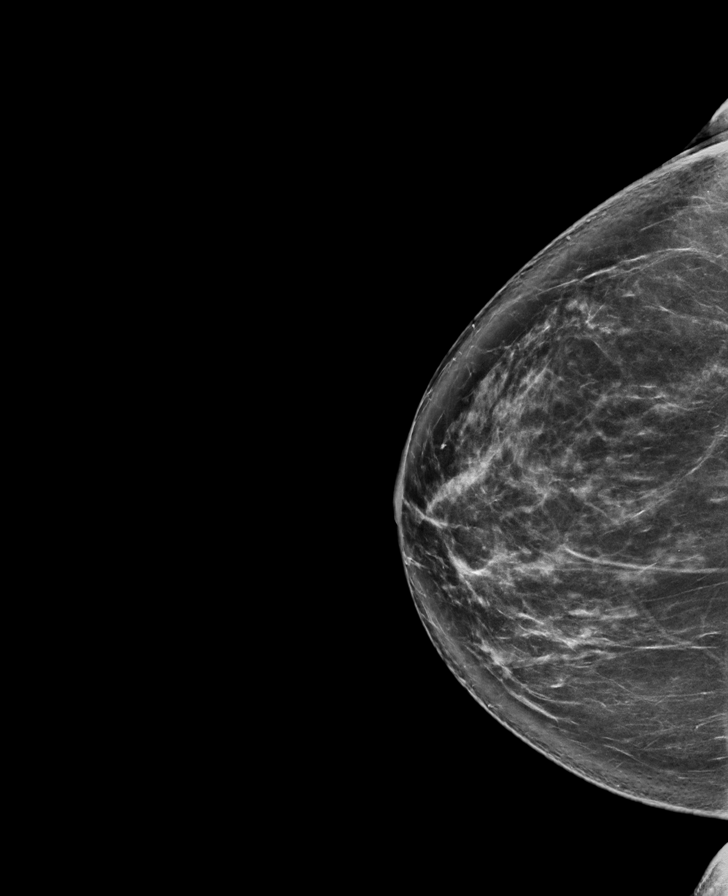

[R MLO synth-2D]
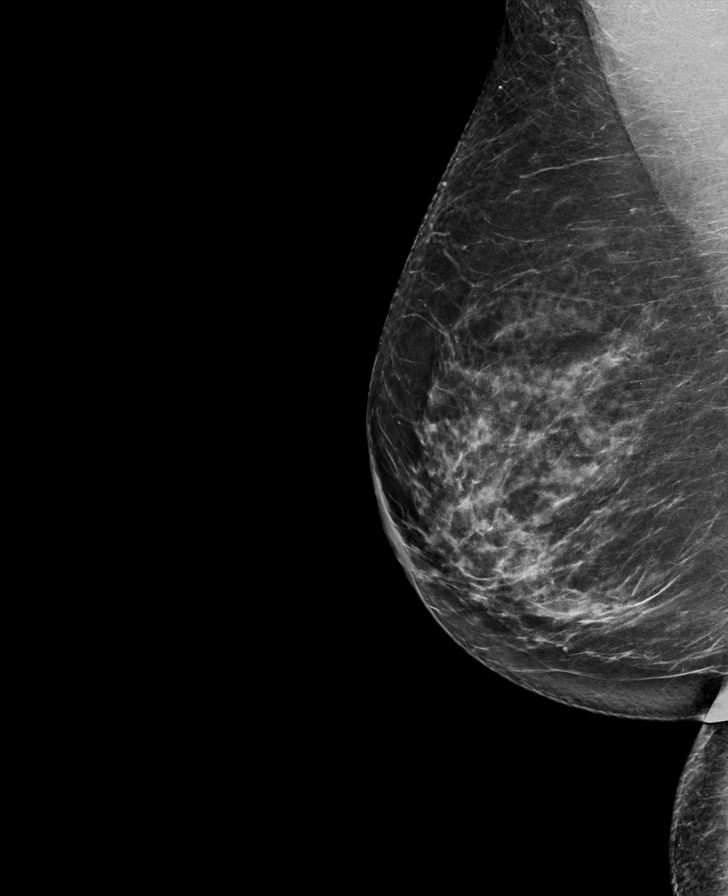

[L MLO synth-2D]
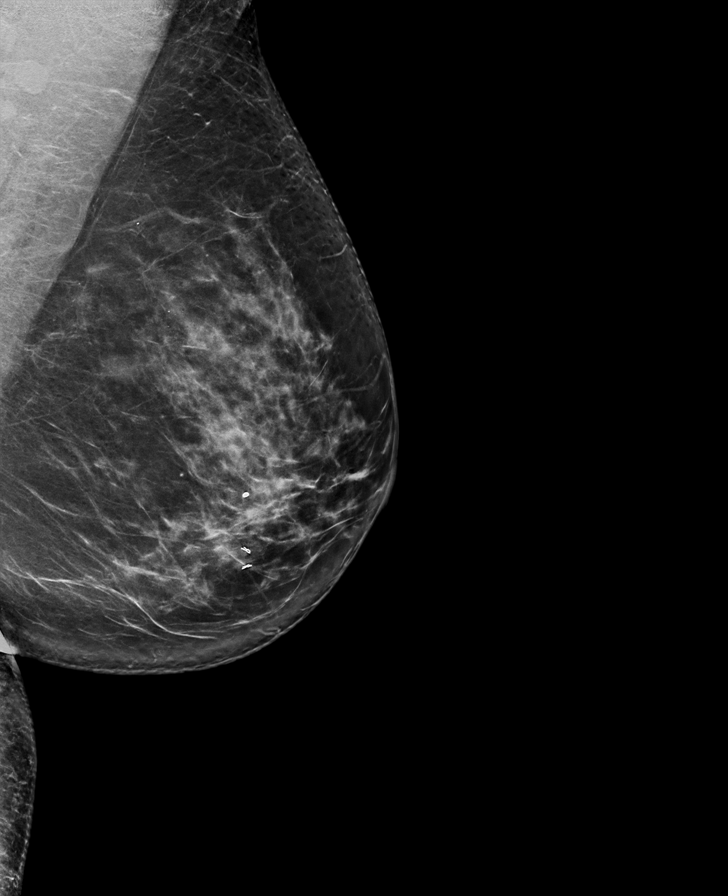

[L CC synth-2D]
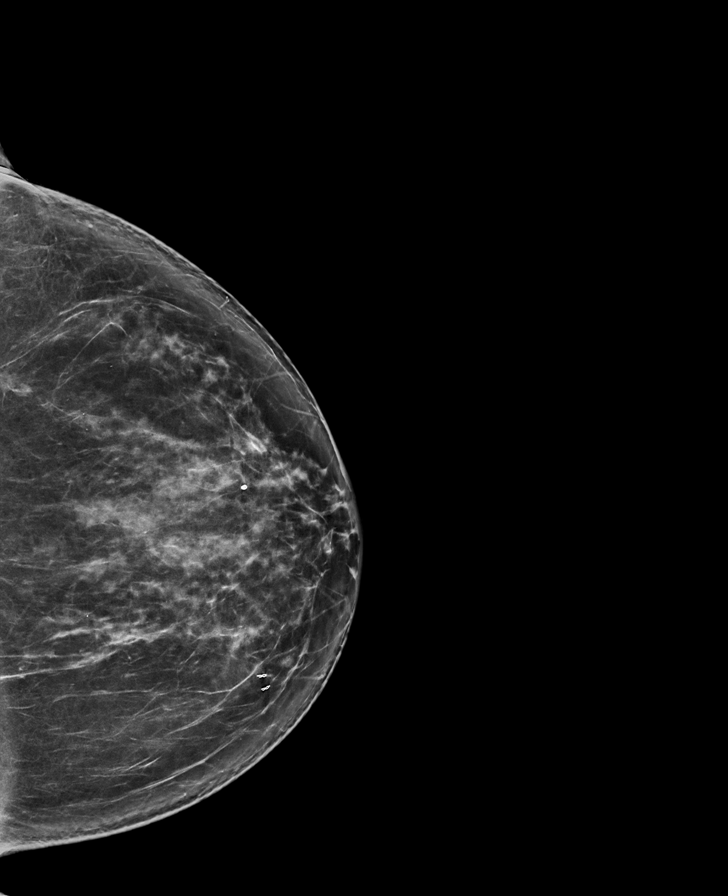

[L MLO tomo · tomo slice 45/88.0]
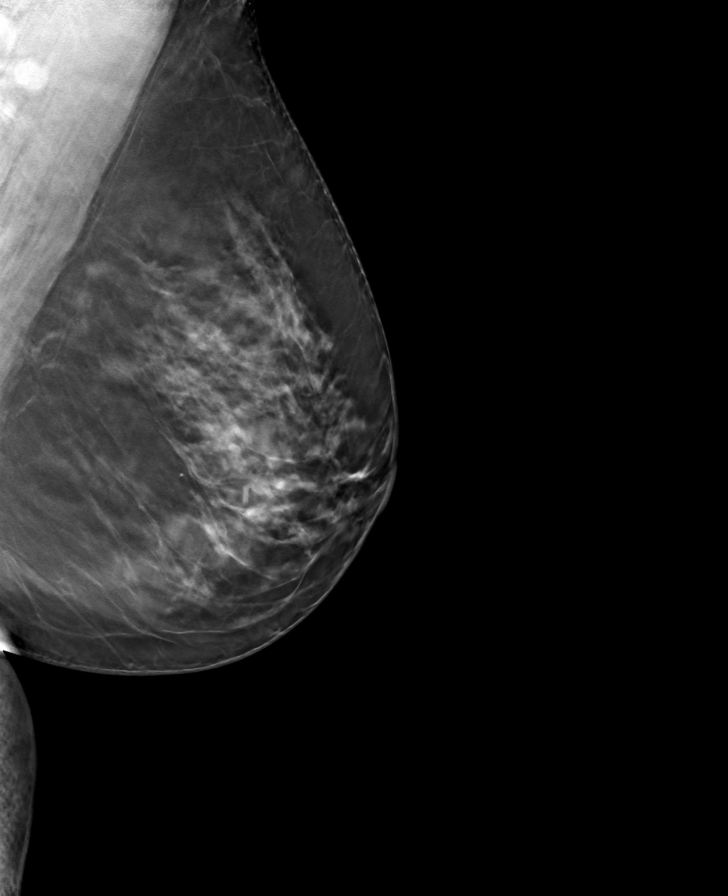

[R MLO tomo · tomo slice 43/85.0]
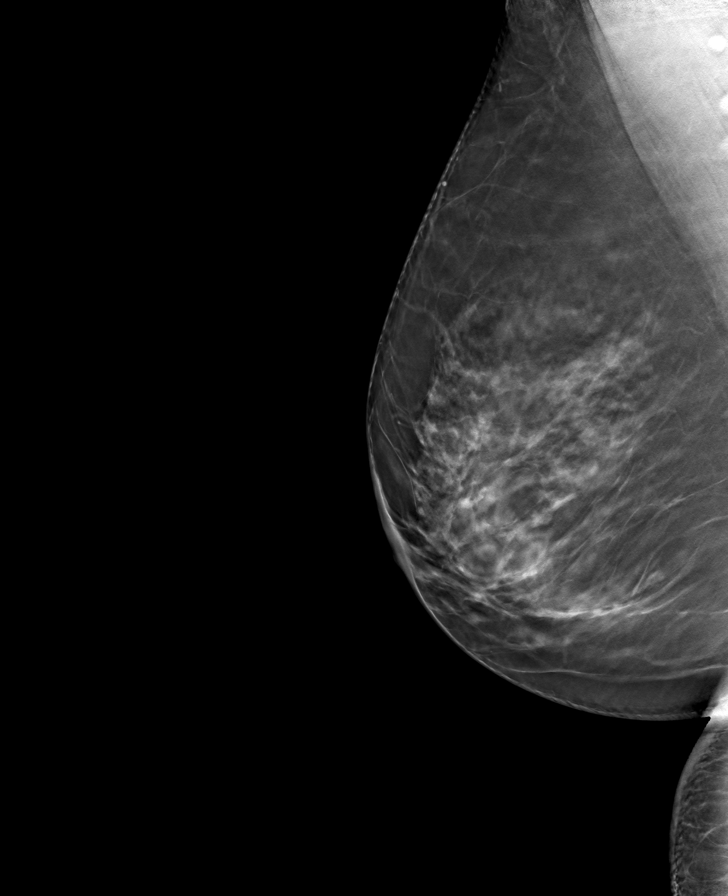

[L CC tomo · tomo slice 44/87.0]
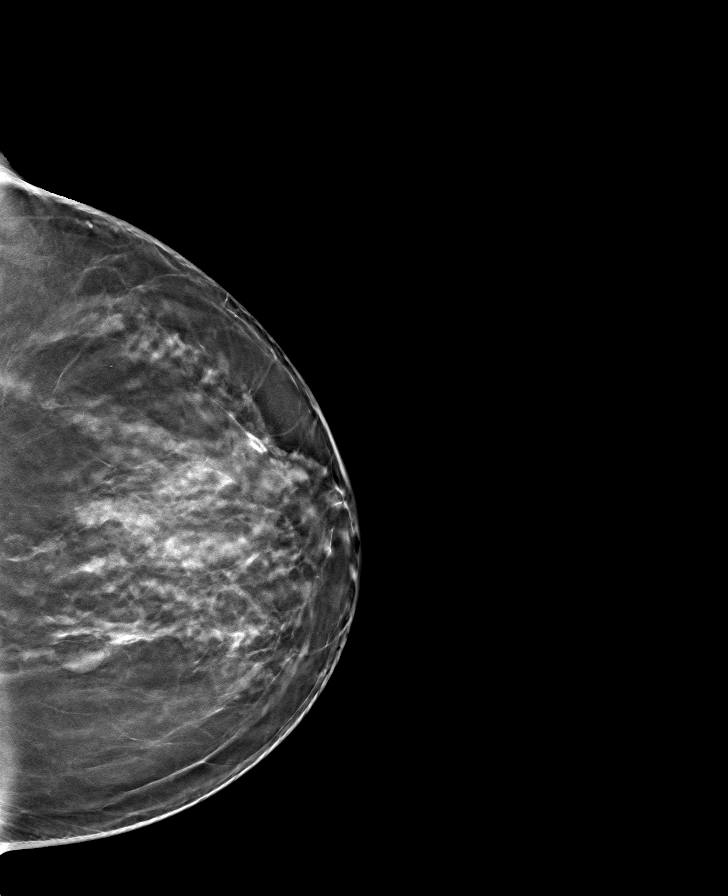

[R CC tomo · tomo slice 46/91.0]
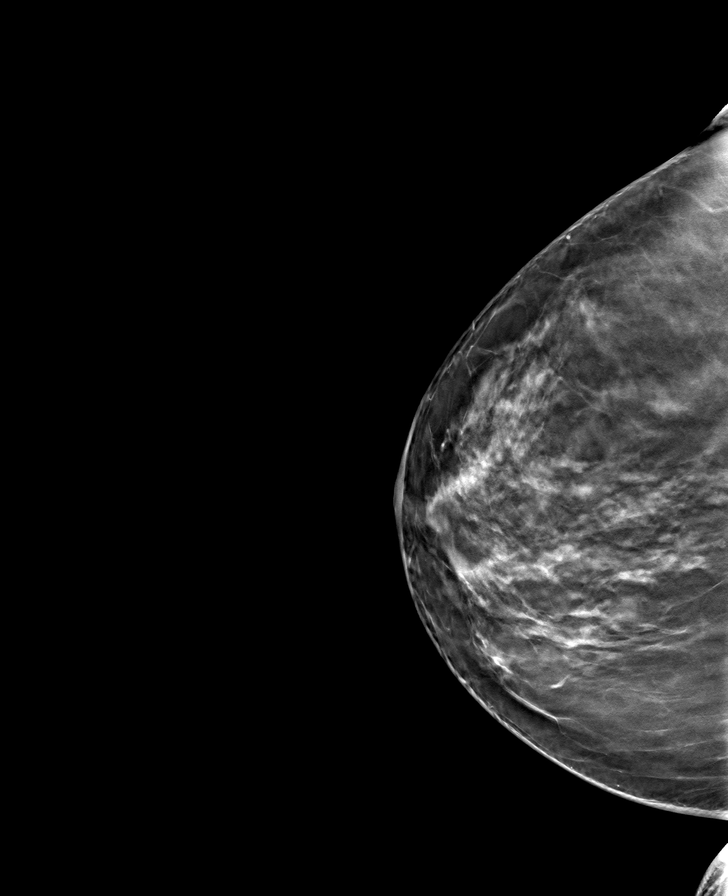

[8 of 24 positions shown; findings below may reference images not displayed]

ACR Breast Density Category c: The breast tissue is heterogeneously
dense, which may obscure small masses.
FINDINGS: There are no findings suspicious for malignancy.
IMPRESSION: No mammographic evidence of malignancy. A result letter of this
screening mammogram will be mailed directly to the patient.

RECOMMENDATION:
Screening mammogram in one year. (Code:[id number])

BI-RADS CATEGORY  1: Negative.

## 2022-09-10 ENCOUNTER — Other Ambulatory Visit: Payer: Self-pay | Admitting: Obstetrics and Gynecology

## 2022-09-10 DIAGNOSIS — Z1231 Encounter for screening mammogram for malignant neoplasm of breast: Secondary | ICD-10-CM

## 2022-10-08 ENCOUNTER — Ambulatory Visit
Admission: RE | Admit: 2022-10-08 | Discharge: 2022-10-08 | Disposition: A | Discharge status: Home / Self Care | Payer: Managed Care, Other (non HMO) | Source: Ambulatory Visit | Attending: Obstetrics and Gynecology | Admitting: Obstetrics and Gynecology

## 2022-10-08 DIAGNOSIS — Z1231 Encounter for screening mammogram for malignant neoplasm of breast: Secondary | ICD-10-CM | POA: Diagnosis present

## 2023-08-03 ENCOUNTER — Emergency Department: Payer: Managed Care, Other (non HMO)

## 2023-08-03 ENCOUNTER — Other Ambulatory Visit: Payer: Self-pay

## 2023-08-03 ENCOUNTER — Emergency Department
Admission: EM | Admit: 2023-08-03 | Discharge: 2023-08-03 | Disposition: A | Discharge status: Home / Self Care | Payer: Managed Care, Other (non HMO) | Attending: Emergency Medicine | Admitting: Emergency Medicine

## 2023-08-03 DIAGNOSIS — R110 Nausea: Secondary | ICD-10-CM

## 2023-08-03 DIAGNOSIS — J101 Influenza due to other identified influenza virus with other respiratory manifestations: Secondary | ICD-10-CM | POA: Diagnosis not present

## 2023-08-03 DIAGNOSIS — R55 Syncope and collapse: Secondary | ICD-10-CM | POA: Diagnosis not present

## 2023-08-03 DIAGNOSIS — R197 Diarrhea, unspecified: Secondary | ICD-10-CM | POA: Insufficient documentation

## 2023-08-03 DIAGNOSIS — R059 Cough, unspecified: Secondary | ICD-10-CM | POA: Diagnosis present

## 2023-08-03 DIAGNOSIS — J111 Influenza due to unidentified influenza virus with other respiratory manifestations: Secondary | ICD-10-CM

## 2023-08-03 DIAGNOSIS — I10 Essential (primary) hypertension: Secondary | ICD-10-CM | POA: Diagnosis not present

## 2023-08-03 LAB — MAGNESIUM: Magnesium: 1.9 mg/dL (ref 1.7–2.4)

## 2023-08-03 LAB — CBC
HCT: 41.3 % (ref 36.0–46.0)
Hemoglobin: 13.8 g/dL (ref 12.0–15.0)
MCH: 28.9 pg (ref 26.0–34.0)
MCHC: 33.4 g/dL (ref 30.0–36.0)
MCV: 86.4 fL (ref 80.0–100.0)
Platelets: 214 10*3/uL (ref 150–400)
RBC: 4.78 MIL/uL (ref 3.87–5.11)
RDW: 12.5 % (ref 11.5–15.5)
WBC: 6.5 10*3/uL (ref 4.0–10.5)
nRBC: 0 % (ref 0.0–0.2)

## 2023-08-03 LAB — BASIC METABOLIC PANEL
Anion gap: 9 (ref 5–15)
BUN: 13 mg/dL (ref 6–20)
CO2: 22 mmol/L (ref 22–32)
Calcium: 9.2 mg/dL (ref 8.9–10.3)
Chloride: 101 mmol/L (ref 98–111)
Creatinine, Ser: 0.61 mg/dL (ref 0.44–1.00)
GFR, Estimated: >60 mL/min (ref >60)
Glucose, Bld: 105 mg/dL — ABNORMAL HIGH (ref 70–99)
Potassium: 3.9 mmol/L (ref 3.5–5.1)
Sodium: 132 mmol/L — ABNORMAL LOW (ref 135–145)

## 2023-08-03 LAB — HEPATIC FUNCTION PANEL
ALT: 24 U/L (ref 0–44)
AST: 23 U/L (ref 15–41)
Albumin: 3.9 g/dL (ref 3.5–5.0)
Alkaline Phosphatase: 49 U/L (ref 38–126)
Bilirubin, Direct: <0.1 mg/dL (ref 0.0–0.2)
Total Bilirubin: 0.4 mg/dL (ref 0.0–1.2)
Total Protein: 7.4 g/dL (ref 6.5–8.1)

## 2023-08-03 LAB — URINALYSIS, ROUTINE W REFLEX MICROSCOPIC
Bilirubin Urine: NEGATIVE
Glucose, UA: NEGATIVE mg/dL
Hgb urine dipstick: NEGATIVE
Ketones, ur: 5 mg/dL — AB
Leukocytes,Ua: NEGATIVE
Nitrite: NEGATIVE
Protein, ur: NEGATIVE mg/dL
Specific Gravity, Urine: 1.004 — ABNORMAL LOW (ref 1.005–1.030)
pH: 7 (ref 5.0–8.0)

## 2023-08-03 LAB — RESP PANEL BY RT-PCR (RSV, FLU A&B, COVID)  RVPGX2
Influenza A by PCR: POSITIVE — AB
Influenza B by PCR: NEGATIVE
Resp Syncytial Virus by PCR: NEGATIVE
SARS Coronavirus 2 by RT PCR: NEGATIVE

## 2023-08-03 LAB — TROPONIN I (HIGH SENSITIVITY)
Troponin I (High Sensitivity): <2 ng/L (ref <18)
Troponin I (High Sensitivity): <2 ng/L (ref <18)

## 2023-08-03 LAB — LACTIC ACID, PLASMA: Lactic Acid, Venous: 1.2 mmol/L (ref 0.5–1.9)

## 2023-08-03 MED ORDER — BENZONATATE 100 MG PO CAPS: 100.0000 mg | ORAL_CAPSULE | Freq: Three times a day (TID) | ORAL | 0 refills | Status: AC | PRN

## 2023-08-03 MED ORDER — LACTATED RINGERS IV BOLUS
1000.0000 mL | Freq: Once | INTRAVENOUS | Status: AC
  Administered 2023-08-03: 1000 mL via INTRAVENOUS

## 2023-08-03 MED ORDER — OSELTAMIVIR PHOSPHATE 75 MG PO CAPS
75.0000 mg | ORAL_CAPSULE | Freq: Two times a day (BID) | ORAL | 0 refills | Status: AC
Start: 2023-08-03 — End: 2023-08-08

## 2023-08-03 MED ORDER — ONDANSETRON 4 MG PO TBDP: 4.0000 mg | ORAL_TABLET | Freq: Three times a day (TID) | ORAL | 0 refills | Status: DC | PRN

## 2023-08-03 NOTE — ED Triage Notes (Signed)
 Pt to ED from [facility]. Had 3 syncopal episodes  this morning after feeling dizzy. Had not eaten since yesterday. Also pt began having diarrhea after she passed out. Has also had a cough since past few days. Skin is dry, pt in NAD.
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
## 2023-09-17 ENCOUNTER — Ambulatory Visit

## 2023-09-17 ENCOUNTER — Ambulatory Visit: Payer: Managed Care, Other (non HMO) | Attending: Cardiology | Admitting: Cardiology

## 2023-09-17 ENCOUNTER — Encounter: Payer: Self-pay | Admitting: Cardiology

## 2023-09-17 VITALS — BP 124/72 | HR 85 | Ht 67.0 in | Wt 175.0 lb

## 2023-09-17 DIAGNOSIS — R55 Syncope and collapse: Secondary | ICD-10-CM

## 2023-09-17 DIAGNOSIS — I10 Essential (primary) hypertension: Secondary | ICD-10-CM

## 2023-09-17 NOTE — Patient Instructions (Addendum)
 Medication Instructions:   Your Physician recommend you continue on your current medication as directed.     *If you need a refill on your cardiac medications before your next appointment, please call your pharmacy*  Lab Work:  No labs ordered today   Testing/Procedures: Your physician has requested that you have an echocardiogram. Echocardiography is a painless test that uses sound waves to create images of your heart. It provides your doctor with information about the size and shape of your heart and how well your heart's chambers and valves are working.   You may receive an ultrasound enhancing agent through an IV if needed to better visualize your heart during the echo. This procedure takes approximately one hour.  There are no restrictions for this procedure.  This will take place at [address] [facility] [address]) [address], [place] [address]  Please note: We ask at that you not bring children with you during ultrasound (echo/ vascular) testing. Due to room size and safety concerns, children are not allowed in the ultrasound rooms during exams. Our front office staff cannot provide observation of children in our lobby area while testing is being conducted. An adult accompanying a patient to their appointment will only be allowed in the ultrasound room at the discretion of the ultrasound technician under special circumstances. We apologize for any inconvenience.   Your physician has recommended that you wear a 14 day Zio monitor.   This monitor is a medical device that records the heart's electrical activity. Doctors most often use these monitors to diagnose arrhythmias. Arrhythmias are problems with the speed or rhythm of the heartbeat. The monitor is a small device applied to your chest. You can wear one while you do your normal daily activities. While wearing this monitor if you have any symptoms to push the button and record what you felt. Once you have worn this monitor for the  period of time provider prescribed (Usually 14 days), you will return the monitor device in the postage paid box. Once it is returned they will download the data collected and provide [place] with a report which the provider will then review and we will call you with those results. Important tips:  Avoid showering during the first 24 hours of wearing the monitor. Avoid excessive sweating to help maximize wear time. Do not submerge the device, no hot tubs, and no swimming pools. Keep any lotions or oils away from the patch. After 24 hours you may shower with the patch on. Take brief showers with your back facing the shower head.  Do not remove patch once it has been placed because that will interrupt data and decrease adhesive wear time. Push the button when you have any symptoms and write down what you were feeling. Once you have completed wearing your monitor, remove and place into box which has postage paid and place in your outgoing mailbox.  If for some reason you have misplaced your box then call our office and we can provide another box and/or mail it off for you.     Follow-Up: At [facility], you and your health needs are our priority.  As part of our continuing mission to provide you with exceptional heart care, our providers are all part of one team.  This team includes your primary Cardiologist (physician) and Advanced Practice Providers or APPs (Physician Assistants and Nurse Practitioners) who all work together to provide you with the care you need, when you need it.  Your next appointment:  2-3 month(s)  Provider:   You may see Dr. Debbe Odea or one of the following Advanced Practice Providers on your designated Care Team:   Nicolasa Ducking, NP Ames Dura, PA-C Eula Listen, PA-C Cadence [place], PA-C Charlsie Quest, NP Carlos Levering, NP    We recommend signing up for the patient portal called "MyChart".  Sign up information is provided on this After Visit  Summary.  MyChart is used to connect with patients for Virtual Visits (Telemedicine).  Patients are able to view lab/test results, encounter notes, upcoming appointments, etc.  Non-urgent messages can be sent to your provider as well.   To learn more about what you can do with MyChart, go to [email or web address].   Other Instructions

## 2023-09-17 NOTE — Progress Notes (Signed)
 Cardiology Office Note:    Date:  09/17/2023   ID:  Kim Morrison, DOB [birth year], MRN [id number]  PCP:  Christeen Douglas, MD   [facility] Providers Cardiologist:  None     Referring MD: Claybon Jabs, MD   No chief complaint on file.  Kim Morrison is a 58 y.o. female who is being seen today for the evaluation of syncope at the request of Tan, Franchot Erichsen, MD.   History of Present Illness:    Kim Morrison is a 58 y.o. female with a hx of HTN who presents due to syncope.  Patient had an episode of syncope 2 months ago.  She was at home, states feeling unwell with symptoms of fatigue, having a cold and dry cough over the previous 3 days.  She walks to her refrigerator to grab a glass of water and suddenly passed out.  Woke up a couple of minutes later, sat on the couch and apparently passed out again.  Was taken to the ED by sister.  Diagnosed with the flu.  She denies any prior episodes, has not had any further episodes.  Denies palpitations, chest pain.  Past Medical History:  Diagnosis Date   GERD (gastroesophageal reflux disease)    OCC    Hypertension     Past Surgical History:  Procedure Laterality Date   ABDOMINAL HYSTERECTOMY     BREAST BIOPSY Left 03/06/2011   negative   BREAST BIOPSY Left 05/30/2017   COLUMNAR CELL CHANGE AND SCLEROSING ADENOSIS WITH ASSOCIATED    BREAST BIOPSY Left 12/25/2017   Procedure: BREAST BIOPSY WITH NEEDLE LOCALIZATION;  Surgeon: Earline Mayotte, MD;  Location: [facility] ORS;  Service: General;  Laterality: Left;   BREAST EXCISIONAL BIOPSY Left 12/25/2017   fibroadenoma, intraductal papilloma     Current Medications: Current Meds  Medication Sig   bisoprolol-hydrochlorothiazide (ZIAC) 2.5-6.25 MG tablet take 1 tablet by mouth once daily-NOON   fluticasone (FLONASE) 50 MCG/ACT nasal spray 1-2 sprays in each nostril daily (Patient taking differently: Place 1-2 sprays into both nostrils daily as needed for allergies.)    Multiple Vitamin (MULTIVITAMIN WITH MINERALS) TABS tablet Take 1 tablet by mouth every other day.   naproxen sodium (ALEVE) 220 MG tablet Take 220 mg by mouth daily as needed (pain).   Tetrahydrozoline HCl (VISINE OP) Place 1 drop into both eyes daily as needed (allergies).     Allergies:   Patient has no known allergies.   Social History   Socioeconomic History   Marital status: Single    Spouse name: Not on file   Number of children: Not on file   Years of education: Not on file   Highest education level: Not on file  Occupational History   Not on file  Tobacco Use   Smoking status: Never   Smokeless tobacco: Never  Vaping Use   Vaping status: Never Used  Substance and Sexual Activity   Alcohol use: Yes    Comment: RARE   Drug use: Never   Sexual activity: Yes  Other Topics Concern   Not on file  Social History Narrative   Not on file   Social Drivers of Health   Financial Resource Strain: Not on file  Food Insecurity: Not on file  Transportation Needs: Not on file  Physical Activity: Not on file  Stress: Not on file  Social Connections: Not on file     Family History: The patient's family history includes Heart disease in her  mother; Hypertension in her father, maternal grandmother, mother, and sister; Prostate cancer in her father. There is no history of Breast cancer.  ROS:   Please see the history of present illness.     All other systems reviewed and are negative.  EKGs/Labs/Other Studies Reviewed:    The following studies were reviewed today:  EKG Interpretation Date/Time:  Tuesday September 17 2023 09:21:04 EDT Ventricular Rate:  85 PR Interval:  172 QRS Duration:  72 QT Interval:  374 QTC Calculation: 445 R Axis:   -4  Text Interpretation: Sinus rhythm with Premature atrial complexes Confirmed by Debbe Odea ([id number]) on 09/17/2023 9:29:14 AM    Recent Labs: 08/03/2023: ALT 24; BUN 13; Creatinine, Ser 0.61; Hemoglobin 13.8; Magnesium 1.9;  Platelets 214; Potassium 3.9; Sodium 132  Recent Lipid Panel    Component Value Date/Time   CHOL 195 03/30/2020 1324   TRIG 76 03/30/2020 1324   HDL 57 03/30/2020 1324   CHOLHDL 3.4 03/30/2020 1324   LDLCALC 124 (H) 03/30/2020 1324     Risk Assessment/Calculations:         Physical Exam:    VS:  BP 124/72 (BP Location: Left Arm, Patient Position: Sitting, Cuff Size: Normal)   Pulse 85   Ht 5\' 7"  (1.702 m)   Wt 175 lb (79.4 kg)   SpO2 99%   BMI 27.41 kg/m     Wt Readings from Last 3 Encounters:  09/17/23 175 lb (79.4 kg)  08/03/23 183 lb (83 kg)  03/30/20 180 lb (81.6 kg)     GEN:  Well nourished, well developed in no acute distress HEENT: Normal NECK: No JVD; No carotid bruits CARDIAC: RRR, no murmurs, rubs, gallops RESPIRATORY:  Clear to auscultation without rales, wheezing or rhonchi  ABDOMEN: Soft, non-tender, non-distended MUSCULOSKELETAL:  No edema; No deformity  SKIN: Warm and dry NEUROLOGIC:  Alert and oriented x 3 PSYCHIATRIC:  Normal affect   ASSESSMENT:    1. Syncope, unspecified syncope type   2. Primary hypertension    PLAN:    In order of problems listed above:  Syncope, etiology appears vasovagal secondary to influenza infection.  Denies any prior symptoms, has not had any episodes since.  Complete cardiac workup with cardiac monitor and echocardiogram.  Reassured patient if no significant structural abnormalities noted on cardiac testing. Hypertension, BP controlled.  Continue bisoprolol-HCTZ 2.5-6.25 mg daily.  Follow-up in 2 to 3 months      Medication Adjustments/Labs and Tests Ordered: Current medicines are reviewed at length with the patient today.  Concerns regarding medicines are outlined above.  Orders Placed This Encounter  Procedures   LONG TERM MONITOR (3-14 DAYS)   EKG 12-Lead   ECHOCARDIOGRAM COMPLETE   No orders of the defined types were placed in this encounter.   Patient Instructions  Medication Instructions:    Your Physician recommend you continue on your current medication as directed.     *If you need a refill on your cardiac medications before your next appointment, please call your pharmacy*  Lab Work:  No labs ordered today   Testing/Procedures: Your physician has requested that you have an echocardiogram. Echocardiography is a painless test that uses sound waves to create images of your heart. It provides your doctor with information about the size and shape of your heart and how well your heart's chambers and valves are working.   You may receive an ultrasound enhancing agent through an IV if needed to better visualize your heart during the echo. This  procedure takes approximately one hour.  There are no restrictions for this procedure.  This will take place at [address] [facility], [place] [address]  Please note: We ask at that you not bring children with you during ultrasound (echo/ vascular) testing. Due to room size and safety concerns, children are not allowed in the ultrasound rooms during exams. Our front office staff cannot provide observation of children in our lobby area while testing is being conducted. An adult accompanying a patient to their appointment will only be allowed in the ultrasound room at the discretion of the ultrasound technician under special circumstances. We apologize for any inconvenience.   Your physician has recommended that you wear a 14 day Zio monitor.   This monitor is a medical device that records the heart's electrical activity. Doctors most often use these monitors to diagnose arrhythmias. Arrhythmias are problems with the speed or rhythm of the heartbeat. The monitor is a small device applied to your chest. You can wear one while you do your normal daily activities. While wearing this monitor if you have any symptoms to push the button and record what you felt. Once you have worn this monitor for the period of time provider  prescribed (Usually 14 days), you will return the monitor device in the postage paid box. Once it is returned they will download the data collected and provide [place] with a report which the provider will then review and we will call you with those results. Important tips:  Avoid showering during the first 24 hours of wearing the monitor. Avoid excessive sweating to help maximize wear time. Do not submerge the device, no hot tubs, and no swimming pools. Keep any lotions or oils away from the patch. After 24 hours you may shower with the patch on. Take brief showers with your back facing the shower head.  Do not remove patch once it has been placed because that will interrupt data and decrease adhesive wear time. Push the button when you have any symptoms and write down what you were feeling. Once you have completed wearing your monitor, remove and place into box which has postage paid and place in your outgoing mailbox.  If for some reason you have misplaced your box then call our office and we can provide another box and/or mail it off for you.     Follow-Up: At [facility], you and your health needs are our priority.  As part of our continuing mission to provide you with exceptional heart care, our providers are all part of one team.  This team includes your primary Cardiologist (physician) and Advanced Practice Providers or APPs (Physician Assistants and Nurse Practitioners) who all work together to provide you with the care you need, when you need it.  Your next appointment:   2-3 month(s)  Provider:   You may see Dr. Debbe Odea or one of the following Advanced Practice Providers on your designated Care Team:   Nicolasa Ducking, NP Ames Dura, PA-C Eula Listen, PA-C Cadence Norwood, PA-C Charlsie Quest, NP Carlos Levering, NP    We recommend signing up for the patient portal called "MyChart".  Sign up information is provided on this After Visit Summary.  MyChart is used  to connect with patients for Virtual Visits (Telemedicine).  Patients are able to view lab/test results, encounter notes, upcoming appointments, etc.  Non-urgent messages can be sent to your provider as well.   To learn more about what you can do  with MyChart, go to [email or web address].   Other Instructions        Signed, Debbe Odea, MD  09/17/2023 12:19 PM    [facility]

## 2023-09-18 ENCOUNTER — Other Ambulatory Visit: Payer: Self-pay | Admitting: Obstetrics and Gynecology

## 2023-09-18 DIAGNOSIS — Z1231 Encounter for screening mammogram for malignant neoplasm of breast: Secondary | ICD-10-CM

## 2023-09-26 ENCOUNTER — Other Ambulatory Visit: Payer: Self-pay | Admitting: Cardiology

## 2023-09-26 DIAGNOSIS — I10 Essential (primary) hypertension: Secondary | ICD-10-CM

## 2023-09-26 DIAGNOSIS — R55 Syncope and collapse: Secondary | ICD-10-CM

## 2023-10-09 ENCOUNTER — Ambulatory Visit: Attending: Cardiology

## 2023-10-09 DIAGNOSIS — R55 Syncope and collapse: Secondary | ICD-10-CM

## 2023-10-09 DIAGNOSIS — I10 Essential (primary) hypertension: Secondary | ICD-10-CM

## 2023-10-09 LAB — ECHOCARDIOGRAM COMPLETE
AR max vel: 1.84 cm2
AV Area VTI: 1.95 cm2
AV Area mean vel: 1.82 cm2
AV Mean grad: 4 mmHg
AV Peak grad: 7.3 mmHg
Ao pk vel: 1.35 m/s
Area-P 1/2: 3.65 cm2
S' Lateral: 2.41 cm

## 2023-10-11 DIAGNOSIS — R55 Syncope and collapse: Secondary | ICD-10-CM | POA: Diagnosis not present

## 2023-10-11 DIAGNOSIS — I10 Essential (primary) hypertension: Secondary | ICD-10-CM | POA: Diagnosis not present

## 2023-11-08 ENCOUNTER — Ambulatory Visit
Admission: RE | Admit: 2023-11-08 | Discharge: 2023-11-08 | Disposition: A | Discharge status: Home / Self Care | Source: Ambulatory Visit | Attending: Obstetrics and Gynecology | Admitting: Obstetrics and Gynecology

## 2023-11-08 DIAGNOSIS — Z1231 Encounter for screening mammogram for malignant neoplasm of breast: Secondary | ICD-10-CM | POA: Insufficient documentation

## 2023-11-21 ENCOUNTER — Ambulatory Visit: Attending: Cardiology | Admitting: Cardiology

## 2023-11-21 VITALS — BP 142/72 | HR 85 | Ht 67.0 in | Wt 180.2 lb

## 2023-11-21 DIAGNOSIS — I10 Essential (primary) hypertension: Secondary | ICD-10-CM | POA: Diagnosis not present

## 2023-11-21 DIAGNOSIS — R55 Syncope and collapse: Secondary | ICD-10-CM | POA: Diagnosis not present

## 2023-11-21 NOTE — Progress Notes (Signed)
 Cardiology Office Note:    Date:  11/21/2023   ID:  Kim Morrison, DOB 03-03-1966, MRN [id number]  PCP:  Prescilla Brod, MD   [facility] Providers Cardiologist:  None     Referring MD: Prescilla Brod, MD   Chief Complaint  Patient presents with   Follow-up    History of Present Illness:    Kim Morrison is a 58 y.o. female with a hx of HTN who presents for follow-up.  She was previously seen due to syncope.  Syncope occurred in the context of a flu.  Suggesting vasovagal etiology.  Cardiac workup with echo and monitor all obtained to rule out cardiac etiology.  Patient has not had any symptoms since.  Presents for cardiac testing results.  No new concerns at this time.   Past Medical History:  Diagnosis Date   GERD (gastroesophageal reflux disease)    OCC    Hypertension     Past Surgical History:  Procedure Laterality Date   ABDOMINAL HYSTERECTOMY     BREAST BIOPSY Left 03/06/2011   negative   BREAST BIOPSY Left 05/30/2017   COLUMNAR CELL CHANGE AND SCLEROSING ADENOSIS WITH ASSOCIATED    BREAST BIOPSY Left 12/25/2017   Procedure: BREAST BIOPSY WITH NEEDLE LOCALIZATION;  Surgeon: Marshall Skeeter, MD;  Location: [facility] ORS;  Service: General;  Laterality: Left;   BREAST EXCISIONAL BIOPSY Left 12/25/2017   fibroadenoma, intraductal papilloma     Current Medications: Current Meds  Medication Sig   bisoprolol -hydrochlorothiazide (ZIAC) 2.5-6.25 MG tablet take 1 tablet by mouth once daily-NOON   fluticasone  (FLONASE ) 50 MCG/ACT nasal spray 1-2 sprays in each nostril daily   Multiple Vitamin (MULTIVITAMIN WITH MINERALS) TABS tablet Take 1 tablet by mouth daily.   naproxen  sodium (ALEVE ) 220 MG tablet Take 220 mg by mouth daily as needed (pain).   Tetrahydrozoline HCl (VISINE OP) Place 1 drop into both eyes daily as needed (allergies).   Vitamin D-Vitamin K (VITAMIN K2-VITAMIN D3 PO) Take 1 tablet by mouth daily.     Allergies:   Patient has no  known allergies.   Social History   Socioeconomic History   Marital status: Single    Spouse name: Not on file   Number of children: Not on file   Years of education: Not on file   Highest education level: Not on file  Occupational History   Not on file  Tobacco Use   Smoking status: Never   Smokeless tobacco: Never  Vaping Use   Vaping status: Never Used  Substance and Sexual Activity   Alcohol use: Yes    Comment: RARE   Drug use: Never   Sexual activity: Yes  Other Topics Concern   Not on file  Social History Narrative   Not on file   Social Drivers of Health   Financial Resource Strain: Low Risk  (09/18/2023)   Received [facility]   Overall Financial Resource Strain (CARDIA)    Difficulty of Paying Living Expenses: Not hard at all  Food Insecurity: No Food Insecurity (09/18/2023)   Received from [facility]   Hunger Vital Sign    Worried About Running Out of Food in the Last Year: Never true    Ran Out of Food in the Last Year: Never true  Transportation Needs: No Transportation Needs (09/18/2023)   Received from [facility] - Transportation    In the past 12 months, has lack of transportation  kept you from medical appointments or from getting medications?: No    Lack of Transportation (Non-Medical): No  Physical Activity: Not on file  Stress: Not on file  Social Connections: Not on file     Family History: The patient's family history includes Heart disease in her mother; Hypertension in her father, maternal grandmother, mother, and sister; Prostate cancer in her father. There is no history of Breast cancer.  ROS:   Please see the history of present illness.     All other systems reviewed and are negative.  EKGs/Labs/Other Studies Reviewed:    The following studies were reviewed today:       Recent Labs: 08/03/2023: ALT 24; BUN 13; Creatinine, Ser 0.61; Hemoglobin 13.8; Magnesium 1.9;  Platelets 214; Potassium 3.9; Sodium 132  Recent Lipid Panel    Component Value Date/Time   CHOL 195 03/30/2020 1324   TRIG 76 03/30/2020 1324   HDL 57 03/30/2020 1324   CHOLHDL 3.4 03/30/2020 1324   LDLCALC 124 (H) 03/30/2020 1324     Risk Assessment/Calculations:         Physical Exam:    VS:  BP (!) 142/72 (BP Location: Left Arm, Patient Position: Sitting, Cuff Size: Normal)   Pulse 85   Ht 5' 7 (1.702 m)   Wt 180 lb 3.2 oz (81.7 kg)   SpO2 98%   BMI 28.22 kg/m     Wt Readings from Last 3 Encounters:  11/21/23 180 lb 3.2 oz (81.7 kg)  09/17/23 175 lb (79.4 kg)  08/03/23 183 lb (83 kg)     GEN:  Well nourished, well developed in no acute distress HEENT: Normal NECK: No JVD; No carotid bruits CARDIAC: RRR, no murmurs, rubs, gallops RESPIRATORY:  Clear to auscultation without rales, wheezing or rhonchi  ABDOMEN: Soft, non-tender, non-distended MUSCULOSKELETAL:  No edema; No deformity  SKIN: Warm and dry NEUROLOGIC:  Alert and oriented x 3 PSYCHIATRIC:  Normal affect   ASSESSMENT:    1. Syncope, unspecified syncope type   2. Primary hypertension    PLAN:    In order of problems listed above:  Syncope, etiology appears vasovagal secondary to influenza infection.  Cardiac monitor 5/25 showing nonsustained SVT, 1 episode of nonsustained VT lasting 7 beats, occasional PVCs.  Echo with normal EF.  Denies any prior symptoms, has not had any episodes since.  . Hypertension, BP elevated today, usually controlled.  Continue bisoprolol -HCTZ 2.5-6.25 mg daily.  Follow-up as needed.     Medication Adjustments/Labs and Tests Ordered: Current medicines are reviewed at length with the patient today.  Concerns regarding medicines are outlined above.  No orders of the defined types were placed in this encounter.  No orders of the defined types were placed in this encounter.   Patient Instructions  Medication Instructions:  Your physician recommends that you continue  on your current medications as directed. Please refer to the Current Medication list given to you today.   *If you need a refill on your cardiac medications before your next appointment, please call your pharmacy*  Lab Work: No labs ordered today  If you have labs (blood work) drawn today and your tests are completely normal, you will receive your results only by: MyChart Message (if you have MyChart) OR A paper copy in the mail If you have any lab test that is abnormal or we need to change your treatment, we will call you to review the results.  Testing/Procedures: No test ordered today   Follow-Up: At [facility], you and your health needs are our priority.  As part of our continuing mission to provide you with exceptional heart care, our providers are all part of one team.  This team includes your primary Cardiologist (physician) and Advanced Practice Providers or APPs (Physician Assistants and Nurse Practitioners) who all work together to provide you with the care you need, when you need it.  Your next appointment:   Follow up as needed with Dr Junnie Olives or APP        Signed, Constancia Delton, MD  11/21/2023 9:03 AM    [facility]

## 2023-11-21 NOTE — Patient Instructions (Signed)
 Medication Instructions:  Your physician recommends that you continue on your current medications as directed. Please refer to the Current Medication list given to you today.   *If you need a refill on your cardiac medications before your next appointment, please call your pharmacy*  Lab Work: No labs ordered today  If you have labs (blood work) drawn today and your tests are completely normal, you will receive your results only by: MyChart Message (if you have MyChart) OR A paper copy in the mail If you have any lab test that is abnormal or we need to change your treatment, we will call you to review the results.  Testing/Procedures: No test ordered today   Follow-Up: At [facility], you and your health needs are our priority.  As part of our continuing mission to provide you with exceptional heart care, our providers are all part of one team.  This team includes your primary Cardiologist (physician) and Advanced Practice Providers or APPs (Physician Assistants and Nurse Practitioners) who all work together to provide you with the care you need, when you need it.  Your next appointment:   Follow up as needed with Dr Junnie Olives or APP
